# Patient Record
Sex: Male | Born: 1945 | Race: White | Hispanic: No | Marital: Married | Smoking: Former smoker
Health system: Southern US, Community
[De-identification: ages and names within clinical notes are randomized; demographics above are authoritative.]

## PROBLEM LIST (undated history)

## (undated) DIAGNOSIS — I10 Essential (primary) hypertension: Secondary | ICD-10-CM

## (undated) DIAGNOSIS — K4090 Unilateral inguinal hernia, without obstruction or gangrene, not specified as recurrent: Secondary | ICD-10-CM

## (undated) DIAGNOSIS — K219 Gastro-esophageal reflux disease without esophagitis: Secondary | ICD-10-CM

## (undated) DIAGNOSIS — I2510 Atherosclerotic heart disease of native coronary artery without angina pectoris: Secondary | ICD-10-CM

## (undated) DIAGNOSIS — C61 Malignant neoplasm of prostate: Secondary | ICD-10-CM

## (undated) DIAGNOSIS — M1990 Unspecified osteoarthritis, unspecified site: Secondary | ICD-10-CM

## (undated) DIAGNOSIS — I209 Angina pectoris, unspecified: Secondary | ICD-10-CM

## (undated) HISTORY — PX: KNEE ARTHROSCOPY: SHX127

## (undated) HISTORY — PX: TONSILLECTOMY: SUR1361

## (undated) HISTORY — PX: INGUINAL HERNIA REPAIR: SUR1180

## (undated) HISTORY — PX: JOINT REPLACEMENT: SHX530

---

## 1998-03-10 ENCOUNTER — Other Ambulatory Visit: Admission: RE | Admit: 1998-03-10 | Discharge: 1998-03-10 | Payer: Self-pay | Admitting: Dermatology

## 2000-11-02 ENCOUNTER — Encounter (INDEPENDENT_AMBULATORY_CARE_PROVIDER_SITE_OTHER): Payer: Self-pay | Admitting: Specialist

## 2000-11-02 ENCOUNTER — Other Ambulatory Visit: Admission: RE | Admit: 2000-11-02 | Discharge: 2000-11-02 | Payer: Self-pay | Admitting: Urology

## 2000-12-06 HISTORY — PX: PROSTATECTOMY: SHX69

## 2000-12-09 ENCOUNTER — Encounter: Payer: Self-pay | Admitting: Urology

## 2000-12-13 ENCOUNTER — Inpatient Hospital Stay (HOSPITAL_COMMUNITY): Admission: RE | Admit: 2000-12-13 | Discharge: 2000-12-17 | Payer: Self-pay | Admitting: Urology

## 2000-12-13 ENCOUNTER — Encounter (INDEPENDENT_AMBULATORY_CARE_PROVIDER_SITE_OTHER): Payer: Self-pay

## 2007-09-06 HISTORY — PX: SHOULDER ARTHROSCOPY: SHX128

## 2007-09-26 ENCOUNTER — Ambulatory Visit (HOSPITAL_BASED_OUTPATIENT_CLINIC_OR_DEPARTMENT_OTHER): Admission: RE | Admit: 2007-09-26 | Discharge: 2007-09-26 | Payer: Self-pay | Admitting: Orthopedic Surgery

## 2011-04-20 NOTE — Op Note (Signed)
NAME:  Timothy Mendez, Timothy Mendez NO.:  [id number]   MEDICAL RECORD NO.:  [id number]          PATIENT TYPE:  AMB   LOCATION:  DSC                          FACILITY:  [facility]   PHYSICIAN:  Timothy Mendez, M.D. DATE OF BIRTH:  02-03-1946   DATE OF PROCEDURE:  09/26/2007  DATE OF DISCHARGE:                               OPERATIVE REPORT   PREOPERATIVE DIAGNOSIS:  Chronic pain right shoulder with unfavorable  acromioclavicular anatomy and MRI evidence of a partial-thickness tear  of the supraspinatus and conjoined tendons.   POSTOPERATIVE DIAGNOSIS:  Chronic adhesive capsulitis with marked  impairment of capsular range of motion, right shoulder and chronic stage  2+ impingement with prominent acromioclavicular arthropathy with an  inferior projecting distal clavicle and medial acromion causing chronic  stage 2+ impingement.   OPERATION:  1. Examination of right shoulder under anesthesia documenting      significant right shoulder adhesive capsulitis.  2. Arthroscopic evaluation of right glenohumeral joint with      debridement of labral degenerative fray/tear, debridement of      adhesive capsulitis granulation tissue and scar adhesions and      inspection of rotator cuff.  3. Arthroscopic subacromial debridement with bursectomy,      coracoacromial ligament relaxation and extensive bursectomy.  4. Arthroscopic subacromial decompression with removal of osteophyte      at medial acromion, anterior and lateral acromion.  5. Arthroscopic distal clavicle resection.   OPERATING SURGEON:  Timothy Mendez, M.D.   ASSISTANT:  Timothy Maduro Dasnoit PA-C.   ANESTHESIA:  General endotracheal supplemented by a right interscalene  block, supervising anesthesiologist is Dr. Sampson Mendez.   INDICATIONS:  Timothy Mendez is a 65 year old gentleman referred  by Dr. Earl Mendez for evaluation of a painful right shoulder.   He has a history of night pain, loss of range of motion, impingement  type symptoms and possible rotator cuff weakness.  Clinical examination  suggested unfavorable acromial anatomy, sclerosis of greater tuberosity  consistent with stage 2 or 3 impingement and MRI of the shoulder  documented extensive tendinopathy rotator cuff with a possible  intrasubstance tear of the supraspinatus and the conjoint tendon.   Due to failure to respond to nonoperative measures, Timothy Mendez is  brought to the operating at this time anticipating arthroscopic  evaluation of shoulder, subacromial decompression, distal clavicle  resection and rotator cuff repair as indicated by our findings at  surgery.   After detailed informed consent, both in the office and in the holding  area, he is brought to the operating room at this time.   Written questions were provided by his wife in the holding area and  answered in detail.   PROCEDURE:  Timothy Mendez is brought to the operating room and  placed supine position on the table.   Following an anesthesia consult with Dr. Sampson Mendez a right interscalene  block was placed without complication.   Timothy Mendez was placed under general endotracheal anesthesia under Dr.  Jarrett Mendez direct supervision followed by careful positioning in the  beach-chair position with the aid of a torso and head holder designed  for  shoulder arthroscopy.   The right upper extremity and forequarter were prepped with DuraPrep and  draped with impervious arthroscopy drapes.   The procedure commenced with examination of the right shoulder under  anesthesia.  The combined elevation of the shoulder was noted to be 160  degrees.  Timothy Mendez lacked 30 degrees of extension to the scapular  plane.  He had external rotation of 80 degrees and internal rotation of  60 degrees.  This evidenced moderate adhesive capsulitis.   The scope was introduced through a standard posterior viewing portal..  Diagnostic arthroscopy revealed considerable degenerative  change in the  superior and anterior labrum.  There was a remarkable degree of the  glenohumeral granulation tissue obscuring the anatomy of the anterior  capsular ligaments.   We had some significant technical difficulties with our camera system  with some type of digital camera malfunction requiring [place] to switch both  our camera and our monitors during the procedure.  Some of our initial  images from the surgery including the debridement of granulation tissue  were lost when our circulating nurse turned off the power to our initial  arthroscopic tower.   The deep surface of the rotator cuff was carefully inspected.  There was  no sign of a retracted or significantly degenerative tear of the  supraspinatus, infraspinatus noted within the joint.  Careful study of  the preoperative MRI suggested that Timothy Mendez had some cavitation  within the substance of the tendon.   After completion of debridement of the granulation tissue and tenolysis  of the subscapularis and removal of adhesions from the anterior capsular  ligaments.  A bipolar cautery was used for hemostasis.   The scope was removed glenohumeral joint and placed in the subacromial  space.  There were considerable bursal adhesions.  These were debrided  to reveal the anatomy of the coracoacromial arch.  The medial acromion  and distal clavicle were quite prominent and causing abrasion of the  cuff.   After clearance of the bursa with the cautery and suction shaver, the  acromion was leveled to a type 1 morphology with removal of about 5 mm  of bone from the medial margin of the acromion at the [facility] joint and  across the anterior acromion and lateral anterior acromion.   The coracoacromial ligament was released with cutting cautery and  hemostasis achieved with bipolar cautery.  The bursa overlying the cuff  was thoroughly debrided.  The rotator cuff was carefully inspected.  There is no sign of retracted cuff tear on the  bursal side.  The capsule  of the [facility] joint was taken down with the cutting cautery followed by  resection of the distal 8 mm clavicle superiorly and perhaps 15 mm of  clavicle inferiorly with the suction bur.  Photographic documentation of  the clavicle resection was accomplished with a digital camera.   We were challenged to throughout the case by tendency for Timothy Mendez to  remain hypertensive.  Dr. Sampson Mendez was consulted and provided  hydralazine as an antihypertensive.   Timothy Mendez has not been noted to be hypertensive preoperatively, but we  will inform Timothy Mendez and Dr. 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   At the conclusion of the case the portals repaired with mattress suture  of 3-0 Prolene.  The wounds were otherwise dressed with Xeroflo sterile  gauze and a paper tape dressing.  There no apparent complications other  than our challenges  with our arthroscopic visualization software and  imaging system.      Timothy Fitch Mendez, M.D.  Electronically Signed     RVS/MEDQ  D:  09/26/2007  T:  09/27/2007  Job:  [id number]   cc:   Timothy Mendez, M.D.

## 2011-04-23 NOTE — H&P (Signed)
[facility]  Patient:    Timothy Mendez, Timothy Mendez               MRN: [id number] Adm. Date:  [id number] Attending:  Ellwood Handler                         History and Physical  DATE OF BIRTH:  10-23-1946  REFERRING PHYSICIAN:  Winn Jock. Earl Gala, M.D.  INDICATIONS:  A 65 year old male with prostate cancer.  ______: 1. November 2001 urologic physical examination with [name] revealed    subtle asymmetry of the prostate, left lobe larger than right.  PSA has    ranged from 2.0-1.4. 2. 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  Bone scan and CT scan not done due    to the low volume of the disease and low PSA. 3. Discussed with patient and family members the various options available to    [place] including watchful waiting, hormonal therapy, external radiation    therapy, seed implant therapy, and prostatectomy either retropubic or    perineal.  Patient was offered referral to a ______ care center, but    preferred to stay here in [place]. 4. After patient had time to "think it over" he was inclined to proceed with    surgical prostatectomy due to his young age, family history, and overall    good health.  I have discussed with him the risks and benefits of    prostatectomy including bleeding, infection, impotence, incontinence as    well as nerve, vascular, and rectal injury.  He understands the risks and    benefits and agrees to proceed.  MEDICATIONS:  Aspirin and ibuprofen just on a p.r.n. basis.  ALLERGIES:  Denies.  SOCIAL HISTORY:  Tobacco:  Patient wisely quit smoking in 1976.  ETOH: Occasional.  A 11 year old wife.  Has three children-32, 30, and 47 years of age.  PAST MEDICAL HISTORY:  Right knee 1967 and 1981, hernia repair in 1950.  PHYSICAL EXAMINATION:  GENERAL:  Healthy, active, and alert 65 year old male looking younger than his staged  age.  VITAL SIGNS:  Weight 201 pounds, blood pressure 146/87, pulse 64, temperature 97.8.  NECK:  Negative adenopathy.  Negative bruit.  LUNGS:  Clear to P&A.  HEART:  Regular rate and rhythm without murmur or gallop.  ABDOMEN:  Soft, protuberant with positive bowel sounds.  No organomegaly or mass.  GENITOURINARY:  Left lobe of the prostate larger than the right.  Bilaterally descended testes.  No evidence of inguinal hernia.  NEUROLOGIC:  Grossly intact.  LABORATORIES:  BUN 15, creatinine 1.1.  PT 12.5, PTT 28.  White count 5.1, hemoglobin 14.1.  Patient has 2 units of autologous blood available.  Chest x-ray:  "No active lung disease."  EKG shows normal sinus rhythm with unspecified T-wave abnormalities.  PLAN:  Will proceed this a.m. with retropubic prostatectomy. DD:  12/13/00 TD:  12/13/00 Job: [id number]

## 2011-04-23 NOTE — Op Note (Signed)
[facility]  Patient:    Kirsten, Mckone WHITFIELD DULAY               MRN: [id number] Proc. Date: 12/13/00 Adm. Date:  [id number] Attending:  Ellwood Handler CC:         Winn Jock. Earl Gala, M.D.                           Operative Report  DATE OF BIRTH:  [birth year].  REFERRING PHYSICIAN:  Winn Jock. Earl Gala, M.D.  PREOPERATIVE DIAGNOSIS:  Prostate cancer.  POSTOPERATIVE DIAGNOSIS:  Prostate cancer.  UROLOGIST:  Verl Dicker, M.D.  FIRST ASSISTANT:  Rozanna Boer., M.D.  PROCEDURE:  Radical retropubic prostatectomy and pelvic lymph node dissection.  SPECIMENS:  Prostate with attached vas and seminal vesicles, right and left pelvic lymph nodes.  ESTIMATED BLOOD LOSS:  1100 cc.  DRAINS:  A 20-French Foley, 10-French JP.  DESCRIPTION OF PROCEDURE:  Patient was prepped and draped in the dorsal lithotomy position after institution of an adequate level of general anesthesia.  Midline infraumbilical incision was carried through the skin and muscular layers of the anterior abdominal wall.  Bookwalter retractor was put in position, taking care to retract the peritoneal cavity superiorly, with good visualization of the retropubic space as well as the right and left obturator fossae.  No dissection was carried out on both the right and left sides between the external iliac veins superiorly and the obturator nerves inferiorly.  Aggregate of soft fibrolymphatic tissue was removed in both cases.  No enlarged or indurated nodes were identified.  All nodes were sent for permanent section.  Endopelvic fascia was taken down sharply lateral to the prostate.  Puboprostatic ligaments were then taken down sharply. Right-angle clamp was then passed distal to the prostatic apex, anterior to the urethra, dorsal vein complex oversewn with three free ties of 0 Vicryl, back-bleeding stitch placed on the anterior prostate also of 0 Vicryl; dorsal vein  complex was then divided, providing clear visualization of the urethra. Careful dissection around the urethra freed it from the surrounding attachments.  It was then divided with a scalpel.  Indwelling Foley catheter was retracted superiorly.  Right and left prostatic pedicles were then taken down segmentally, taking every care to preserve the neurovascular bundles. There was some adherence of tissue to the neurovascular bundle on the left, but we maintained an adequate margin, as this was the site of the patients tumor.  There was no evidence of adherence on the right side.  Muscular fibers at the bladder neck were then divided using fine tonsils and needle-tip hemostat.  Longitudinal muscle fibers were preserved as they entered the prostatic base.  Bladder neck was then transected and bladder was retracted superiorly.  Right and left seminal vesicles were then dissected free. Right-angle clamp was passed about three-quarters of the length of the seminal vesicle.  It was then divided.  Prostate with attached vaginal seminal vesicles was then sent to pathology.  Ends of the seminal vesicles were then ligated with a free tie of 2-0 silk.  Mucosa at the bladder neck was then everted with interrupted sutures of 4-0 chromic.  Greenwald sound was then passed per urethra and stitches were placed in the urethral stump at the 12 oclock, 10 oclock, 7 oclock, 5 oclock and 2 oclock positions.  A 20-French silicone catheter was then passed per urethra and guided into the bladder.  Stitches which had  been placed in the urethral stump were then brought through the bladder neck in an identical configuration.  Fifteen cc of water were placed in a 5 cc balloon.  Urethral anastomosis was then sewn down and shown to be watertight.  A 10 flat Jackson-Pratt drain was brought out through a stab incision in the left lower quadrant.  Fascia was reapproximated with a running suture of #1 PDS and skin was closed with  skin staples. Patient was returned to recovery in satisfactory condition. DD:  12/13/00 TD:  12/13/00 Job: [id number]

## 2011-04-23 NOTE — Discharge Summary (Signed)
[facility]  Patient:    Timothy Mendez, Timothy Mendez               MRN: [id number] Adm. Date:  [id number] Disc. Date: [id number] Attending:  Ellwood Handler                           Discharge Summary  DATE OF BIRTH:  1946-11-28  INTERNIST:  Winn Jock. Earl Gala, M.D.  UROLOGIST:  Verl Dicker, M.D.  Indications, medications, allergies, tobacco, ETOH, past medical history, social history, physical examination, and review of systems, all outlined in the admitting note from December 13, 2000.  HOSPITAL COURSE:  The patient was admitted on December 13, 2000.  Radical retropubic prostatectomy and pelvic lymph node dissection that morning. Postoperative day #1, hemoglobin 10.1.  Good urine output, declining JP output.  By postoperative day #2, however, hemoglobin had fallen to 8.7.  The patient had donated autologous blood.  Two units were given without difficulty.  By postoperative day #3, hemoglobin was 11.6, Jackson-Pratt output had declined to about 15 cc per shift.  Jackson-Pratt was removed, and the patient was discharged home on postoperative day #4, December 17, 2000.  DISCHARGE MEDICATIONS: 1. Cipro. 2. Darvocet. 3. Ditropan.  Pathology report was pending at the time of dictation.  PLAN:  Office visit, Foley catheter removal, staple removal, in 10 to 14 days. The patient will call the office if he has any questions or problems. DD:  12/30/00 TD:  12/30/00 Job: [id number]

## 2011-09-15 LAB — POCT HEMOGLOBIN-HEMACUE
Hemoglobin: 16.8
Operator id: 112821

## 2011-12-09 DIAGNOSIS — D239 Other benign neoplasm of skin, unspecified: Secondary | ICD-10-CM | POA: Diagnosis not present

## 2011-12-09 DIAGNOSIS — L570 Actinic keratosis: Secondary | ICD-10-CM | POA: Diagnosis not present

## 2011-12-09 DIAGNOSIS — L851 Acquired keratosis [keratoderma] palmaris et plantaris: Secondary | ICD-10-CM | POA: Diagnosis not present

## 2011-12-09 DIAGNOSIS — L821 Other seborrheic keratosis: Secondary | ICD-10-CM | POA: Diagnosis not present

## 2011-12-09 DIAGNOSIS — D485 Neoplasm of uncertain behavior of skin: Secondary | ICD-10-CM | POA: Diagnosis not present

## 2011-12-29 DIAGNOSIS — R339 Retention of urine, unspecified: Secondary | ICD-10-CM | POA: Diagnosis not present

## 2011-12-29 DIAGNOSIS — N393 Stress incontinence (female) (male): Secondary | ICD-10-CM | POA: Diagnosis not present

## 2011-12-29 DIAGNOSIS — C61 Malignant neoplasm of prostate: Secondary | ICD-10-CM | POA: Diagnosis not present

## 2011-12-29 DIAGNOSIS — N529 Male erectile dysfunction, unspecified: Secondary | ICD-10-CM | POA: Diagnosis not present

## 2012-03-15 DIAGNOSIS — L600 Ingrowing nail: Secondary | ICD-10-CM | POA: Diagnosis not present

## 2012-05-15 DIAGNOSIS — H109 Unspecified conjunctivitis: Secondary | ICD-10-CM | POA: Diagnosis not present

## 2012-05-16 DIAGNOSIS — H52229 Regular astigmatism, unspecified eye: Secondary | ICD-10-CM | POA: Diagnosis not present

## 2012-05-16 DIAGNOSIS — H5200 Hypermetropia, unspecified eye: Secondary | ICD-10-CM | POA: Diagnosis not present

## 2012-05-16 DIAGNOSIS — H1030 Unspecified acute conjunctivitis, unspecified eye: Secondary | ICD-10-CM | POA: Diagnosis not present

## 2012-05-29 DIAGNOSIS — H04129 Dry eye syndrome of unspecified lacrimal gland: Secondary | ICD-10-CM | POA: Diagnosis not present

## 2012-05-29 DIAGNOSIS — H52229 Regular astigmatism, unspecified eye: Secondary | ICD-10-CM | POA: Diagnosis not present

## 2012-05-29 DIAGNOSIS — H5200 Hypermetropia, unspecified eye: Secondary | ICD-10-CM | POA: Diagnosis not present

## 2012-06-07 DIAGNOSIS — E7800 Pure hypercholesterolemia, unspecified: Secondary | ICD-10-CM | POA: Diagnosis not present

## 2012-06-07 DIAGNOSIS — Z1331 Encounter for screening for depression: Secondary | ICD-10-CM | POA: Diagnosis not present

## 2012-06-07 DIAGNOSIS — Z0000 Encounter for general adult medical examination without abnormal findings: Secondary | ICD-10-CM | POA: Diagnosis not present

## 2012-06-07 DIAGNOSIS — Z79899 Other long term (current) drug therapy: Secondary | ICD-10-CM | POA: Diagnosis not present

## 2012-07-04 DIAGNOSIS — D044 Carcinoma in situ of skin of scalp and neck: Secondary | ICD-10-CM | POA: Diagnosis not present

## 2012-07-05 DIAGNOSIS — Z1211 Encounter for screening for malignant neoplasm of colon: Secondary | ICD-10-CM | POA: Diagnosis not present

## 2012-07-05 DIAGNOSIS — D126 Benign neoplasm of colon, unspecified: Secondary | ICD-10-CM | POA: Diagnosis not present

## 2012-07-05 DIAGNOSIS — K5730 Diverticulosis of large intestine without perforation or abscess without bleeding: Secondary | ICD-10-CM | POA: Diagnosis not present

## 2012-07-10 DIAGNOSIS — L821 Other seborrheic keratosis: Secondary | ICD-10-CM | POA: Diagnosis not present

## 2012-07-10 DIAGNOSIS — L570 Actinic keratosis: Secondary | ICD-10-CM | POA: Diagnosis not present

## 2012-07-10 DIAGNOSIS — C4442 Squamous cell carcinoma of skin of scalp and neck: Secondary | ICD-10-CM | POA: Diagnosis not present

## 2012-09-18 DIAGNOSIS — Z23 Encounter for immunization: Secondary | ICD-10-CM | POA: Diagnosis not present

## 2012-09-26 DIAGNOSIS — M1710 Unilateral primary osteoarthritis, unspecified knee: Secondary | ICD-10-CM | POA: Diagnosis not present

## 2012-11-01 DIAGNOSIS — M719 Bursopathy, unspecified: Secondary | ICD-10-CM | POA: Diagnosis not present

## 2012-11-01 DIAGNOSIS — M67919 Unspecified disorder of synovium and tendon, unspecified shoulder: Secondary | ICD-10-CM | POA: Diagnosis not present

## 2012-11-07 DIAGNOSIS — M719 Bursopathy, unspecified: Secondary | ICD-10-CM | POA: Diagnosis not present

## 2012-11-07 DIAGNOSIS — M67919 Unspecified disorder of synovium and tendon, unspecified shoulder: Secondary | ICD-10-CM | POA: Diagnosis not present

## 2012-11-07 DIAGNOSIS — M24119 Other articular cartilage disorders, unspecified shoulder: Secondary | ICD-10-CM | POA: Diagnosis not present

## 2012-11-14 DIAGNOSIS — M719 Bursopathy, unspecified: Secondary | ICD-10-CM | POA: Diagnosis not present

## 2012-11-14 DIAGNOSIS — M24119 Other articular cartilage disorders, unspecified shoulder: Secondary | ICD-10-CM | POA: Diagnosis not present

## 2012-11-21 DIAGNOSIS — M719 Bursopathy, unspecified: Secondary | ICD-10-CM | POA: Diagnosis not present

## 2012-11-21 DIAGNOSIS — M779 Enthesopathy, unspecified: Secondary | ICD-10-CM | POA: Diagnosis not present

## 2012-11-21 DIAGNOSIS — M24119 Other articular cartilage disorders, unspecified shoulder: Secondary | ICD-10-CM | POA: Diagnosis not present

## 2012-12-12 DIAGNOSIS — L570 Actinic keratosis: Secondary | ICD-10-CM | POA: Diagnosis not present

## 2012-12-12 DIAGNOSIS — D239 Other benign neoplasm of skin, unspecified: Secondary | ICD-10-CM | POA: Diagnosis not present

## 2012-12-12 DIAGNOSIS — L819 Disorder of pigmentation, unspecified: Secondary | ICD-10-CM | POA: Diagnosis not present

## 2012-12-12 DIAGNOSIS — L821 Other seborrheic keratosis: Secondary | ICD-10-CM | POA: Diagnosis not present

## 2012-12-12 DIAGNOSIS — D1801 Hemangioma of skin and subcutaneous tissue: Secondary | ICD-10-CM | POA: Diagnosis not present

## 2012-12-12 DIAGNOSIS — M24119 Other articular cartilage disorders, unspecified shoulder: Secondary | ICD-10-CM | POA: Diagnosis not present

## 2012-12-12 DIAGNOSIS — L820 Inflamed seborrheic keratosis: Secondary | ICD-10-CM | POA: Diagnosis not present

## 2012-12-12 DIAGNOSIS — M719 Bursopathy, unspecified: Secondary | ICD-10-CM | POA: Diagnosis not present

## 2013-01-03 DIAGNOSIS — M24119 Other articular cartilage disorders, unspecified shoulder: Secondary | ICD-10-CM | POA: Diagnosis not present

## 2013-01-03 DIAGNOSIS — M719 Bursopathy, unspecified: Secondary | ICD-10-CM | POA: Diagnosis not present

## 2013-01-03 DIAGNOSIS — M67919 Unspecified disorder of synovium and tendon, unspecified shoulder: Secondary | ICD-10-CM | POA: Diagnosis not present

## 2013-01-12 DIAGNOSIS — N4340 Spermatocele of epididymis, unspecified: Secondary | ICD-10-CM | POA: Diagnosis not present

## 2013-01-12 DIAGNOSIS — C61 Malignant neoplasm of prostate: Secondary | ICD-10-CM | POA: Diagnosis not present

## 2013-01-12 DIAGNOSIS — N139 Obstructive and reflux uropathy, unspecified: Secondary | ICD-10-CM | POA: Diagnosis not present

## 2013-01-12 DIAGNOSIS — N529 Male erectile dysfunction, unspecified: Secondary | ICD-10-CM | POA: Diagnosis not present

## 2013-01-31 DIAGNOSIS — M67919 Unspecified disorder of synovium and tendon, unspecified shoulder: Secondary | ICD-10-CM | POA: Diagnosis not present

## 2013-05-17 DIAGNOSIS — H5200 Hypermetropia, unspecified eye: Secondary | ICD-10-CM | POA: Diagnosis not present

## 2013-05-17 DIAGNOSIS — H52229 Regular astigmatism, unspecified eye: Secondary | ICD-10-CM | POA: Diagnosis not present

## 2013-05-17 DIAGNOSIS — H524 Presbyopia: Secondary | ICD-10-CM | POA: Diagnosis not present

## 2013-05-17 DIAGNOSIS — H04129 Dry eye syndrome of unspecified lacrimal gland: Secondary | ICD-10-CM | POA: Diagnosis not present

## 2013-06-11 DIAGNOSIS — Z79899 Other long term (current) drug therapy: Secondary | ICD-10-CM | POA: Diagnosis not present

## 2013-06-11 DIAGNOSIS — Z0000 Encounter for general adult medical examination without abnormal findings: Secondary | ICD-10-CM | POA: Diagnosis not present

## 2013-06-11 DIAGNOSIS — Z1331 Encounter for screening for depression: Secondary | ICD-10-CM | POA: Diagnosis not present

## 2013-06-13 DIAGNOSIS — H6120 Impacted cerumen, unspecified ear: Secondary | ICD-10-CM | POA: Diagnosis not present

## 2013-06-13 DIAGNOSIS — H903 Sensorineural hearing loss, bilateral: Secondary | ICD-10-CM | POA: Diagnosis not present

## 2013-07-25 DIAGNOSIS — M7750 Other enthesopathy of unspecified foot: Secondary | ICD-10-CM | POA: Diagnosis not present

## 2013-08-08 DIAGNOSIS — M7750 Other enthesopathy of unspecified foot: Secondary | ICD-10-CM | POA: Diagnosis not present

## 2013-09-21 DIAGNOSIS — Z23 Encounter for immunization: Secondary | ICD-10-CM | POA: Diagnosis not present

## 2013-12-13 DIAGNOSIS — D1801 Hemangioma of skin and subcutaneous tissue: Secondary | ICD-10-CM | POA: Diagnosis not present

## 2013-12-13 DIAGNOSIS — L570 Actinic keratosis: Secondary | ICD-10-CM | POA: Diagnosis not present

## 2013-12-13 DIAGNOSIS — L919 Hypertrophic disorder of the skin, unspecified: Secondary | ICD-10-CM | POA: Diagnosis not present

## 2013-12-13 DIAGNOSIS — L820 Inflamed seborrheic keratosis: Secondary | ICD-10-CM | POA: Diagnosis not present

## 2013-12-13 DIAGNOSIS — L819 Disorder of pigmentation, unspecified: Secondary | ICD-10-CM | POA: Diagnosis not present

## 2013-12-13 DIAGNOSIS — L909 Atrophic disorder of skin, unspecified: Secondary | ICD-10-CM | POA: Diagnosis not present

## 2013-12-13 DIAGNOSIS — L821 Other seborrheic keratosis: Secondary | ICD-10-CM | POA: Diagnosis not present

## 2013-12-13 DIAGNOSIS — D239 Other benign neoplasm of skin, unspecified: Secondary | ICD-10-CM | POA: Diagnosis not present

## 2013-12-27 ENCOUNTER — Ambulatory Visit (INDEPENDENT_AMBULATORY_CARE_PROVIDER_SITE_OTHER): Payer: Medicare Other | Admitting: Podiatry

## 2013-12-27 ENCOUNTER — Encounter: Payer: Self-pay | Admitting: Podiatry

## 2013-12-27 VITALS — BP 124/85 | HR 62 | Resp 16

## 2013-12-27 DIAGNOSIS — M7750 Other enthesopathy of unspecified foot: Secondary | ICD-10-CM

## 2013-12-27 NOTE — Progress Notes (Signed)
Subjective:     Patient ID: Timothy Mendez, male   DOB: 05-01-1946, 68 y.o.   MRN: [id number]  HPI patient presents stating I have another flareup around my left foot that was quite bad that has gotten somewhat better over the last few days. 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 We will consider injection if symptoms remain persistent

## 2014-01-09 DIAGNOSIS — N393 Stress incontinence (female) (male): Secondary | ICD-10-CM | POA: Diagnosis not present

## 2014-01-09 DIAGNOSIS — N4340 Spermatocele of epididymis, unspecified: Secondary | ICD-10-CM | POA: Diagnosis not present

## 2014-01-09 DIAGNOSIS — R339 Retention of urine, unspecified: Secondary | ICD-10-CM | POA: Diagnosis not present

## 2014-01-09 DIAGNOSIS — C61 Malignant neoplasm of prostate: Secondary | ICD-10-CM | POA: Diagnosis not present

## 2014-01-25 ENCOUNTER — Other Ambulatory Visit: Payer: Medicare Other

## 2014-01-30 ENCOUNTER — Ambulatory Visit (INDEPENDENT_AMBULATORY_CARE_PROVIDER_SITE_OTHER): Payer: Medicare Other | Admitting: Podiatry

## 2014-01-30 ENCOUNTER — Encounter: Payer: Self-pay | Admitting: Podiatry

## 2014-01-30 VITALS — BP 163/86 | HR 71 | Resp 16

## 2014-01-30 DIAGNOSIS — M7750 Other enthesopathy of unspecified foot: Secondary | ICD-10-CM

## 2014-01-30 NOTE — Patient Instructions (Signed)

## 2014-01-31 NOTE — Progress Notes (Signed)
Subjective:     Patient ID: Timothy Mendez, male   DOB: Apr 27, 1946, 68 y.o.   MRN: [id number]  HPI patient presents orthotic pickup stating his tendon is improving but still mildly sore left foot   Review of Systems     Objective:   Physical Exam Neurovascular status intact with well orientated x3 and muscle strength adequate with inflammation of a mild nature around the posterior tibial insertion left    Assessment:     Tendinitis left still noted    Plan:     Dispensed orthotics with instructions and reviewed physical therapy and anti-inflammatories to reduce stress against the feet. Reappoint 6 weeks

## 2014-04-01 ENCOUNTER — Ambulatory Visit (INDEPENDENT_AMBULATORY_CARE_PROVIDER_SITE_OTHER): Payer: Medicare Other | Admitting: Podiatry

## 2014-04-01 ENCOUNTER — Encounter: Payer: Self-pay | Admitting: Podiatry

## 2014-04-01 VITALS — BP 134/81 | HR 58 | Resp 18

## 2014-04-01 DIAGNOSIS — M7750 Other enthesopathy of unspecified foot: Secondary | ICD-10-CM

## 2014-04-01 NOTE — Progress Notes (Signed)
° °  Subjective:    Patient ID: Timothy Mendez, male    DOB: 01-01-1946, 68 y.o.   MRN: [id number]  HPI The inserts are doing good and I can wear them all day    Review of Systems     Objective:   Physical Exam        Assessment & Plan:

## 2014-04-01 NOTE — Progress Notes (Signed)
Subjective:     Patient ID: Timothy Mendez, male   DOB: 1946/01/06, 68 y.o.   MRN: [id number]  HPI patient presents stating my foot is doing very well and I am having minimal discomfort   Review of Systems     Objective:   Physical Exam Neurovascular status intact with significant diminishment of discomfort left plantar heel and arch and in the posterior tibial insertion navicular left    Assessment:     Improved fasciitis tendinitis left    Plan:     Discussed continued physical therapy orthotic usage and shoe gear modifications and choices. Patient will be seen back to recheck again as needed

## 2014-06-03 DIAGNOSIS — H25019 Cortical age-related cataract, unspecified eye: Secondary | ICD-10-CM | POA: Diagnosis not present

## 2014-06-03 DIAGNOSIS — H2510 Age-related nuclear cataract, unspecified eye: Secondary | ICD-10-CM | POA: Diagnosis not present

## 2014-06-18 DIAGNOSIS — H903 Sensorineural hearing loss, bilateral: Secondary | ICD-10-CM | POA: Diagnosis not present

## 2014-07-08 DIAGNOSIS — Z0000 Encounter for general adult medical examination without abnormal findings: Secondary | ICD-10-CM | POA: Diagnosis not present

## 2014-07-08 DIAGNOSIS — Z1331 Encounter for screening for depression: Secondary | ICD-10-CM | POA: Diagnosis not present

## 2014-07-08 DIAGNOSIS — Z23 Encounter for immunization: Secondary | ICD-10-CM | POA: Diagnosis not present

## 2014-07-08 DIAGNOSIS — M1710 Unilateral primary osteoarthritis, unspecified knee: Secondary | ICD-10-CM | POA: Diagnosis not present

## 2014-07-08 DIAGNOSIS — Z791 Long term (current) use of non-steroidal anti-inflammatories (NSAID): Secondary | ICD-10-CM | POA: Diagnosis not present

## 2014-07-08 DIAGNOSIS — IMO0002 Reserved for concepts with insufficient information to code with codable children: Secondary | ICD-10-CM | POA: Diagnosis not present

## 2014-07-08 DIAGNOSIS — E7800 Pure hypercholesterolemia, unspecified: Secondary | ICD-10-CM | POA: Diagnosis not present

## 2014-09-25 DIAGNOSIS — Z23 Encounter for immunization: Secondary | ICD-10-CM | POA: Diagnosis not present

## 2014-10-03 DIAGNOSIS — L570 Actinic keratosis: Secondary | ICD-10-CM | POA: Diagnosis not present

## 2014-11-21 DIAGNOSIS — R0982 Postnasal drip: Secondary | ICD-10-CM | POA: Diagnosis not present

## 2014-11-21 DIAGNOSIS — J069 Acute upper respiratory infection, unspecified: Secondary | ICD-10-CM | POA: Diagnosis not present

## 2014-12-16 DIAGNOSIS — D2271 Melanocytic nevi of right lower limb, including hip: Secondary | ICD-10-CM | POA: Diagnosis not present

## 2014-12-16 DIAGNOSIS — L812 Freckles: Secondary | ICD-10-CM | POA: Diagnosis not present

## 2014-12-16 DIAGNOSIS — L821 Other seborrheic keratosis: Secondary | ICD-10-CM | POA: Diagnosis not present

## 2014-12-16 DIAGNOSIS — B351 Tinea unguium: Secondary | ICD-10-CM | POA: Diagnosis not present

## 2014-12-16 DIAGNOSIS — D1801 Hemangioma of skin and subcutaneous tissue: Secondary | ICD-10-CM | POA: Diagnosis not present

## 2014-12-16 DIAGNOSIS — L578 Other skin changes due to chronic exposure to nonionizing radiation: Secondary | ICD-10-CM | POA: Diagnosis not present

## 2014-12-16 DIAGNOSIS — L814 Other melanin hyperpigmentation: Secondary | ICD-10-CM | POA: Diagnosis not present

## 2015-03-13 ENCOUNTER — Encounter (HOSPITAL_COMMUNITY): Payer: Self-pay | Admitting: Emergency Medicine

## 2015-03-13 ENCOUNTER — Emergency Department (HOSPITAL_COMMUNITY): Payer: Medicare Other

## 2015-03-13 ENCOUNTER — Inpatient Hospital Stay (HOSPITAL_COMMUNITY)
Admission: EM | Admit: 2015-03-13 | Discharge: 2015-03-15 | DRG: 247 | Disposition: A | Discharge status: Home / Self Care | Payer: Medicare Other | Attending: Internal Medicine | Admitting: Internal Medicine

## 2015-03-13 ENCOUNTER — Observation Stay (HOSPITAL_COMMUNITY): Payer: Medicare Other

## 2015-03-13 DIAGNOSIS — K219 Gastro-esophageal reflux disease without esophagitis: Secondary | ICD-10-CM | POA: Diagnosis present

## 2015-03-13 DIAGNOSIS — I25110 Atherosclerotic heart disease of native coronary artery with unstable angina pectoris: Principal | ICD-10-CM | POA: Diagnosis present

## 2015-03-13 DIAGNOSIS — Z87891 Personal history of nicotine dependence: Secondary | ICD-10-CM

## 2015-03-13 DIAGNOSIS — Z8249 Family history of ischemic heart disease and other diseases of the circulatory system: Secondary | ICD-10-CM

## 2015-03-13 DIAGNOSIS — I10 Essential (primary) hypertension: Secondary | ICD-10-CM | POA: Diagnosis present

## 2015-03-13 DIAGNOSIS — Z79899 Other long term (current) drug therapy: Secondary | ICD-10-CM

## 2015-03-13 DIAGNOSIS — E785 Hyperlipidemia, unspecified: Secondary | ICD-10-CM | POA: Diagnosis not present

## 2015-03-13 DIAGNOSIS — I2510 Atherosclerotic heart disease of native coronary artery without angina pectoris: Secondary | ICD-10-CM | POA: Diagnosis present

## 2015-03-13 DIAGNOSIS — Z9079 Acquired absence of other genital organ(s): Secondary | ICD-10-CM | POA: Diagnosis present

## 2015-03-13 DIAGNOSIS — I200 Unstable angina: Secondary | ICD-10-CM | POA: Diagnosis not present

## 2015-03-13 DIAGNOSIS — M25569 Pain in unspecified knee: Secondary | ICD-10-CM | POA: Diagnosis present

## 2015-03-13 DIAGNOSIS — R079 Chest pain, unspecified: Secondary | ICD-10-CM | POA: Diagnosis present

## 2015-03-13 DIAGNOSIS — Z8546 Personal history of malignant neoplasm of prostate: Secondary | ICD-10-CM | POA: Diagnosis not present

## 2015-03-13 DIAGNOSIS — I25119 Atherosclerotic heart disease of native coronary artery with unspecified angina pectoris: Secondary | ICD-10-CM | POA: Insufficient documentation

## 2015-03-13 DIAGNOSIS — Z7982 Long term (current) use of aspirin: Secondary | ICD-10-CM

## 2015-03-13 DIAGNOSIS — R0789 Other chest pain: Secondary | ICD-10-CM | POA: Diagnosis not present

## 2015-03-13 HISTORY — DX: Malignant neoplasm of prostate: C61

## 2015-03-13 HISTORY — DX: Unspecified osteoarthritis, unspecified site: M19.90

## 2015-03-13 HISTORY — DX: Angina pectoris, unspecified: I20.9

## 2015-03-13 HISTORY — DX: Gastro-esophageal reflux disease without esophagitis: K21.9

## 2015-03-13 LAB — TROPONIN I
Troponin I: <0.03 ng/mL (ref <0.031)
Troponin I: <0.03 ng/mL (ref <0.031)

## 2015-03-13 LAB — CBC WITH DIFFERENTIAL/PLATELET
Basophils Absolute: 0 10*3/uL (ref 0.0–0.1)
Basophils Relative: 1 % (ref 0–1)
Eosinophils Absolute: 0.2 10*3/uL (ref 0.0–0.7)
Eosinophils Relative: 3 % (ref 0–5)
HEMATOCRIT: 44.8 % (ref 39.0–52.0)
Hemoglobin: 15.6 g/dL (ref 13.0–17.0)
Lymphocytes Relative: 20 % (ref 12–46)
Lymphs Abs: 1.3 10*3/uL (ref 0.7–4.0)
MCH: 32.6 pg (ref 26.0–34.0)
MCHC: 34.8 g/dL (ref 30.0–36.0)
MCV: 93.5 fL (ref 78.0–100.0)
MONOS PCT: 11 % (ref 3–12)
Monocytes Absolute: 0.7 10*3/uL (ref 0.1–1.0)
Neutro Abs: 4.2 10*3/uL (ref 1.7–7.7)
Neutrophils Relative %: 65 % (ref 43–77)
Platelets: 191 10*3/uL (ref 150–400)
RBC: 4.79 MIL/uL (ref 4.22–5.81)
RDW: 12.3 % (ref 11.5–15.5)
WBC: 6.5 10*3/uL (ref 4.0–10.5)

## 2015-03-13 LAB — PROTIME-INR
INR: 0.96 (ref 0.00–1.49)
PROTHROMBIN TIME: 12.8 s (ref 11.6–15.2)

## 2015-03-13 LAB — BASIC METABOLIC PANEL
Anion gap: 7 (ref 5–15)
BUN: 18 mg/dL (ref 6–23)
CO2: 31 mmol/L (ref 19–32)
Calcium: 9.3 mg/dL (ref 8.4–10.5)
Chloride: 101 mmol/L (ref 96–112)
Creatinine, Ser: 1.02 mg/dL (ref 0.50–1.35)
GFR calc Af Amer: 84 mL/min — ABNORMAL LOW (ref >90)
GFR calc non Af Amer: 72 mL/min — ABNORMAL LOW (ref >90)
GLUCOSE: 106 mg/dL — AB (ref 70–99)
Potassium: 3.9 mmol/L (ref 3.5–5.1)
Sodium: 139 mmol/L (ref 135–145)

## 2015-03-13 LAB — D-DIMER, QUANTITATIVE: D-Dimer, Quant: 0.32 ug/mL-FEU (ref 0.00–0.48)

## 2015-03-13 LAB — I-STAT TROPONIN, ED: TROPONIN I, POC: 0 ng/mL (ref 0.00–0.08)

## 2015-03-13 MED ORDER — SODIUM CHLORIDE 0.9 % IJ SOLN
3.0000 mL | Freq: Two times a day (BID) | INTRAMUSCULAR | Status: DC
  Administered 2015-03-14: 3 mL via INTRAVENOUS

## 2015-03-13 MED ORDER — ASPIRIN 81 MG PO CHEW
324.0000 mg | CHEWABLE_TABLET | Freq: Once | ORAL | Status: AC
  Administered 2015-03-13: 324 mg via ORAL
  Filled 2015-03-13: qty 4

## 2015-03-13 MED ORDER — SODIUM CHLORIDE 0.9 % IJ SOLN: 3.0000 mL | INTRAMUSCULAR | Status: DC | PRN

## 2015-03-13 MED ORDER — ASPIRIN 81 MG PO CHEW
81.0000 mg | CHEWABLE_TABLET | ORAL | Status: AC
  Administered 2015-03-14: 81 mg via ORAL
  Filled 2015-03-13: qty 1

## 2015-03-13 MED ORDER — SODIUM CHLORIDE 0.9 % IV SOLN: 250.0000 mL | INTRAVENOUS | Status: DC | PRN

## 2015-03-13 MED ORDER — GI COCKTAIL ~~LOC~~
30.0000 mL | Freq: Four times a day (QID) | ORAL | Status: DC | PRN
  Filled 2015-03-13: qty 30

## 2015-03-13 MED ORDER — ATORVASTATIN CALCIUM 40 MG PO TABS
40.0000 mg | ORAL_TABLET | Freq: Every day | ORAL | Status: DC
  Administered 2015-03-13 – 2015-03-14 (×2): 40 mg via ORAL
  Filled 2015-03-13 (×3): qty 1

## 2015-03-13 MED ORDER — IOHEXOL 350 MG/ML SOLN
80.0000 mL | Freq: Once | INTRAVENOUS | Status: AC | PRN
  Administered 2015-03-13: 80 mL via INTRAVENOUS

## 2015-03-13 MED ORDER — ACETAMINOPHEN 325 MG PO TABS
650.0000 mg | ORAL_TABLET | ORAL | Status: DC | PRN
  Administered 2015-03-13 – 2015-03-15 (×3): 650 mg via ORAL
  Filled 2015-03-13 (×3): qty 2

## 2015-03-13 MED ORDER — METOPROLOL TARTRATE 25 MG PO TABS
50.0000 mg | ORAL_TABLET | Freq: Once | ORAL | Status: AC
  Administered 2015-03-13: 50 mg via ORAL
  Filled 2015-03-13: qty 2

## 2015-03-13 MED ORDER — ENOXAPARIN SODIUM 40 MG/0.4ML ~~LOC~~ SOLN
40.0000 mg | SUBCUTANEOUS | Status: DC
  Administered 2015-03-13 – 2015-03-14 (×2): 40 mg via SUBCUTANEOUS
  Filled 2015-03-13 (×4): qty 0.4

## 2015-03-13 MED ORDER — MORPHINE SULFATE 2 MG/ML IJ SOLN: 2.0000 mg | INTRAMUSCULAR | Status: DC | PRN

## 2015-03-13 MED ORDER — METOPROLOL TARTRATE 25 MG PO TABS
25.0000 mg | ORAL_TABLET | Freq: Two times a day (BID) | ORAL | Status: DC
  Administered 2015-03-13 – 2015-03-15 (×4): 25 mg via ORAL
  Filled 2015-03-13 (×4): qty 1

## 2015-03-13 MED ORDER — ASPIRIN EC 81 MG PO TBEC
81.0000 mg | DELAYED_RELEASE_TABLET | Freq: Every day | ORAL | Status: DC
  Administered 2015-03-14 – 2015-03-15 (×2): 81 mg via ORAL
  Filled 2015-03-13 (×2): qty 1

## 2015-03-13 MED ORDER — ONDANSETRON HCL 4 MG/2ML IJ SOLN: 4.0000 mg | Freq: Four times a day (QID) | INTRAMUSCULAR | Status: DC | PRN

## 2015-03-13 MED ORDER — NITROGLYCERIN 0.4 MG SL SUBL
SUBLINGUAL_TABLET | SUBLINGUAL | Status: AC
  Administered 2015-03-13: 0.4 mg
  Filled 2015-03-13: qty 1

## 2015-03-13 MED ORDER — SODIUM CHLORIDE 0.9 % IV SOLN
INTRAVENOUS | Status: AC
  Administered 2015-03-13: 10:00:00 via INTRAVENOUS

## 2015-03-13 MED ORDER — SODIUM CHLORIDE 0.9 % IV SOLN
1.0000 mL/kg/h | INTRAVENOUS | Status: DC
  Administered 2015-03-14 (×2): 1 mL/kg/h via INTRAVENOUS

## 2015-03-13 NOTE — ED Notes (Signed)
Patient remains in CT - Wife in room and updated.

## 2015-03-13 NOTE — ED Provider Notes (Signed)
Medical screening examination/treatment/procedure(s) were conducted as a shared visit with non-physician practitioner(s) and myself.  I personally evaluated the patient during the encounter.   EKG Interpretation   Date/Time:  Thursday March 13 2015 05:20:32 EDT Ventricular Rate:  58 PR Interval:  168 QRS Duration: 109 QT Interval:  430 QTC Calculation: 422 R Axis:   41 Text Interpretation:  Sinus rhythm Borderline T abnormalities, inferior  leads No significant change since last tracing Confirmed by Glynn Octave [phone]) on 03/13/2015 5:23:52 AM      Results for orders placed or performed during the hospital encounter of 03/13/15  CBC with Differential/Platelet  Result Value Ref Range   WBC 6.5 4.0 - 10.5 K/uL   RBC 4.79 4.22 - 5.81 MIL/uL   Hemoglobin 15.6 13.0 - 17.0 g/dL   HCT 44.8 39.0 - 52.0 %   MCV 93.5 78.0 - 100.0 fL   MCH 32.6 26.0 - 34.0 pg   MCHC 34.8 30.0 - 36.0 g/dL   RDW 12.3 11.5 - 15.5 %   Platelets 191 150 - 400 K/uL   Neutrophils Relative % 65 43 - 77 %   Neutro Abs 4.2 1.7 - 7.7 K/uL   Lymphocytes Relative 20 12 - 46 %   Lymphs Abs 1.3 0.7 - 4.0 K/uL   Monocytes Relative 11 3 - 12 %   Monocytes Absolute 0.7 0.1 - 1.0 K/uL   Eosinophils Relative 3 0 - 5 %   Eosinophils Absolute 0.2 0.0 - 0.7 K/uL   Basophils Relative 1 0 - 1 %   Basophils Absolute 0.0 0.0 - 0.1 K/uL  Basic metabolic panel  Result Value Ref Range   Sodium 139 135 - 145 mmol/L   Potassium 3.9 3.5 - 5.1 mmol/L   Chloride 101 96 - 112 mmol/L   CO2 31 19 - 32 mmol/L   Glucose, Bld 106 (H) 70 - 99 mg/dL   BUN 18 6 - 23 mg/dL   Creatinine, Ser 1.02 0.50 - 1.35 mg/dL   Calcium 9.3 8.4 - 10.5 mg/dL   GFR calc non Af Amer 72 (L) >90 mL/min   GFR calc Af Amer 84 (L) >90 mL/min   Anion gap 7 5 - 15  Protime-INR  Result Value Ref Range   Prothrombin Time 12.8 11.6 - 15.2 seconds   INR 0.96 0.00 - 1.49  I-stat troponin, ED  Result Value Ref Range   Troponin i, poc 0.00 0.00 - 0.08  ng/mL   Comment 3           Dg Chest 2 View  03/13/2015   CLINICAL DATA:  Patient woke up at 2 a.m. with Mid chest pain.  EXAM: CHEST  2 VIEW  COMPARISON:  None.  FINDINGS: Normal heart size and pulmonary vascularity. No focal airspace disease or consolidation in the lungs. No blunting of costophrenic angles. No pneumothorax. Mediastinal contours appear intact. Degenerative changes in the spine.  IMPRESSION: No active cardiopulmonary disease.   Electronically Signed   By: Lucienne Capers M.D.   On: 03/13/2015 05:58    Patient seen by me. Patient with unusual left-sided chest pain just resolved about 15 minutes ago. Lasted for a long period of time. First troponin negative EKG without acute changes however patient's story very concerning for possible cardiac chest pain. D-dimer was ordered and is pending patient did have a long trip from [place] recently. Patient's nontoxic no acute distress. Pain is now resolved completely. Patient will require admission for cardiac rule  out. On exam heart regular rate and rhythm without any murmurs. Lungs are clear bilaterally. Chest nontender.  Fredia Sorrow, MD 03/13/15 [phone]

## 2015-03-13 NOTE — H&P (Signed)
[facility] History and Physical  Kiril Hippe HER:[id number] DOB: January 31, 1946 DOA: 03/13/2015  Referring physician: EDP PCP: Dorian Heckle, MD   Chief Complaint: Chest Pain  HPI: Timothy Mendez is a 69 y.o. male with a PMH of prostate cancer and proctectomy in 2002 who presents to the ED complaining of CP starting after he got back in bed after using the bathroom around 0230 this morning, rated at a 3/10 at onset. The pain does not radiate anywhere and he denies any other symptoms with the chest pain including SOB, palpitations, nausea, vomiting, jaw pain, arm pain or back pain. Further rest has not entirely relieved his pain. He has no hx of similar pain. Pt does have a hx of GERD and does take celebrex occasionally for knee pain. Does not seem to be excessive NSAID use and states this pain is different than his normal GERD pain which he describes as a burning sensation to midline of chest. His brother had a fatal MI at age 32 and his mother had CHF which she passed away from at age 72. Pt denies any changes in activity tolerance recently, gardens regularly and walks his dog. Of note, this past week patient drove to [place] and back, approximately 8 hours each way. He denies any calf pain or hx of DVT/PE.  Pt's EKG and initial troponin were both normal. Due to his age, family hx and abnormal CP presentation, will admit for telemetry monitoring and have consulted pt's cardiologist Dr. Wynonia Lawman. Will continue with serial troponins and pain management as needed.   Review of Systems:  Constitutional: Negative for sweating, clamminess.  HEENT: no changes in vision  Cardio-vascular: Positive for chest pain. Negative for swelling in lower extremities, palpitations  GI: No heartburn, indigestion, abdominal pain, nausea, vomiting, diarrhea, change in bowel habits, loss of appetite  Resp: No shortness of breath with exertion or at rest. No wheezing.No chest wall deformity  Skin: no  rash or lesions.  Musculoskeletal: No joint pain or swelling. No decreased range of motion. No back pain.   Otherwise see HPI  Past Medical History  Diagnosis Date  . Prostate cancer    Past Surgical History  Procedure Laterality Date  . Hernia repair    . Knee surgery    . Prostatectomy     Social History:  reports that he has quit smoking. He does not have any smokeless tobacco history on file. He reports that he drinks alcohol. He reports that he does not use illicit drugs.  No Known Allergies  Family History  Problem Relation Age of Onset  . Heart failure Mother   . Lung cancer Father   . Heart attack Brother   . Diabetes Brother      Prior to Admission medications   Medication Sig Start Date End Date Taking? Authorizing Provider  aspirin EC 81 MG tablet Take 81 mg by mouth daily.   Yes Historical Provider, MD  CALCIUM PO Take 1 tablet by mouth daily.   Yes Historical Provider, MD  celecoxib (CELEBREX) 200 MG capsule Take 200 mg by mouth 2 (two) times daily as needed for moderate pain.    Yes Historical Provider, MD  Cholecalciferol (VITAMIN D PO) Take 1 tablet by mouth daily.   Yes Historical Provider, MD  Glucosamine-Chondroitin (GLUCOSAMINE CHONDR COMPLEX PO) Take 1 tablet by mouth daily.   Yes Historical Provider, MD  Multiple Vitamins-Minerals (MULTIVITAMIN PO) Take 1 tablet by mouth daily.   Yes Historical Provider, MD  Omega-3  Fatty Acids (FISH OIL PO) Take 1 tablet by mouth daily.   Yes Historical Provider, MD   Physical Exam: Filed Vitals:   03/13/15 0630 03/13/15 0701 03/13/15 0730 03/13/15 0800  BP: 148/94 144/84 133/73 144/94  Pulse: 64 65 61 65  Temp:      TempSrc:      Resp: 16 23 20 21   Height:      Weight:      SpO2: 95% 97% 96% 100%    Wt Readings from Last 3 Encounters:  03/13/15 86.183 kg (190 lb)    General:  Appears calm and comfortable. Very pleasant nice man.  Eyes: PERRL, normal lids, irises & conjunctiva ENT: grossly normal hearing,  lips & tongue Neck: no LAD, masses or thyromegaly. No carotid bruits Cardiovascular: RRR, no m/r/g. No LE edema. Respiratory: CTA bilaterally, no w/r/r. Normal respiratory effort. Abdomen: soft, ntnd. Normoactive bowel sounds in all 4 quadrants Skin: no rash or induration seen on limited exam Musculoskeletal: grossly normal tone BUE/BLE. No tenderness to bilateral calves.  Psychiatric: grossly normal mood and affect, speech fluent and appropriate Neurologic: grossly non-focal.          Labs on Admission:  Basic Metabolic Panel:  Recent Labs Lab 03/13/15 0524  NA 139  K 3.9  CL 101  CO2 31  GLUCOSE 106*  BUN 18  CREATININE 1.02  CALCIUM 9.3   CBC:  Recent Labs Lab 03/13/15 0524  WBC 6.5  NEUTROABS 4.2  HGB 15.6  HCT 44.8  MCV 93.5  PLT 191   Cardiac Enzymes: No results for input(s): CKTOTAL, CKMB, CKMBINDEX, TROPONINI in the last 168 hours.  Radiological Exams on Admission: Dg Chest 2 View  03/13/2015   CLINICAL DATA:  Patient woke up at 2 a.m. with Mid chest pain.  EXAM: CHEST  2 VIEW  COMPARISON:  None.  FINDINGS: Normal heart size and pulmonary vascularity. No focal airspace disease or consolidation in the lungs. No blunting of costophrenic angles. No pneumothorax. Mediastinal contours appear intact. Degenerative changes in the spine.  IMPRESSION: No active cardiopulmonary disease.   Electronically Signed   By: Lucienne Capers M.D.   On: 03/13/2015 05:58    EKG: Independently reviewed. Rate 58. Normal sinus rhythm with borderline t wave abnormalities in the inferior leads.   Assessment/Plan Principal Problem:   Chest pain Active Problems:   Hypertension  Chest pain: - Started at rest with no personal cardiac hx.  - Significant family hx of MI and CHF. Hx of recent long car trip. D dimer negative - Negative initial troponin and EKG ~3 hours after onset. - Will admit for telemetry monitoring and serial troponins.  - his cardiologist Dr. Wynonia Lawman has been  consulted and recommended a coronary CT  Hypertension: - No hx of hypertension, max BP in ED of 184/79 on arrival, has continued to drop since then with no intervention, likely in the setting of pain.   Code Status: Full Code DVT Prophylaxis: Levonox, activity as tolerated.  Family Communication: Plan discussed with pt and wife at bedside, they agree with plan.  Disposition Plan: Likely discharge tomorrow am  Time spent: [place], PA-S [facility] Pager [phone]  Costin M. Cruzita Lederer, MD [facility] [phone]

## 2015-03-13 NOTE — ED Notes (Signed)
Patient transported to X-ray 

## 2015-03-13 NOTE — ED Notes (Signed)
Spoke with [facility] regarding patient.  Updated them that Dr. Wynonia Lawman wanted CTA results prior to admitting patient. Dr. Wynonia Lawman paged to make him aware that results are back.  Awaiting call back from Dr. Wynonia Lawman at this time.

## 2015-03-13 NOTE — ED Notes (Signed)
Hospitalist at bedside 

## 2015-03-13 NOTE — Progress Notes (Signed)
Patient remains free of chest pain tonight.  CTA shows a calcium score of 1900 as well as at least severe 2 vessel disease and may be more than that.  Met with patient and wife tonight and discussed the calcium score, CTA, and have recommended that he have cardiac catheterization tomorrow.  Cardiac catheterization procedure was discussed with the patient fully including risks of myocardial infarction, death, stroke, bleeding, arrhythmia, dye allergy, or renal insufficiency. The patient understands and is willing to proceed.  Possibility of stenting discussed with patient at the same time including risks.  Alternate treatment with medicines and bypass grafting also discussed with patient.  Patient would prefer a radial approach and procedure will be done by Dr. Irish Lack or Julianne Handler of [facility] heart care tomorrow.  We'll repeat EKG in the morning.  Go ahead and initiate statin therapy as well as beta blocker therapy.  Kerry Hough MD [facility]

## 2015-03-13 NOTE — ED Notes (Signed)
Cardiology at bedside.

## 2015-03-13 NOTE — Progress Notes (Signed)
MD on call paged to request continuation of home med Celebrex for patient report of knee pain.

## 2015-03-13 NOTE — ED Provider Notes (Signed)
CSN: [id number]     Arrival date & time 03/13/15  3329 History   First MD Initiated Contact with Patient 03/13/15 [phone]     Chief Complaint  Patient presents with  . Chest Pain     (Consider location/radiation/quality/duration/timing/severity/associated sxs/prior Treatment) HPI Comments: Patient presents today with chest pain.  She began having chest pain around 2:30 AM this morning and has been constant since that time.   He states that he began having pain when he got up to use the bathroom.  He does not think that the pain woke him up from sleep.  He was not doing anything exertional at the onset of pain.  He states that he then sat down and read a book for an hour and his pain improved substantially.  Pain located left anterior chest and does not radiate.  He states that he has never had pain like this before.  He denies any SOB, nausea, vomiting, diaphoresis, cough, hemoptysis, numbness, tingling, dizziness, syncope, or LE pain/swelling.  He denies any prior cardiac history.  No history of HTN, DM, or Hyperlipidemia.   He does not smoke.  He does report that his brother died of a MI at the age of 9.  He He denies every having a Cardiac Cath or Stress Test.  He reports that he saw Dr. Wynonia Lawman with Cardiology 10 years ago, but can not remember what he saw him for.  He denies history of PE or DVT.  Denies prolonged travel or surgeries in the past 4 weeks.    The history is provided by the patient.    Past Medical History  Diagnosis Date  . Prostate cancer    Past Surgical History  Procedure Laterality Date  . Hernia repair    . Knee surgery    . Prostatectomy     No family history on file. History  Substance Use Topics  . Smoking status: Former Research scientist (life sciences)  . Smokeless tobacco: Not on file  . Alcohol Use: Yes    Review of Systems  All other systems reviewed and are negative.     Allergies  Review of patient's allergies indicates no known allergies.  Home Medications   Prior to  Admission medications   Medication Sig Start Date End Date Taking? Authorizing Provider  aspirin EC 81 MG tablet Take 81 mg by mouth daily.   Yes Historical Provider, MD  CALCIUM PO Take 1 tablet by mouth daily.   Yes Historical Provider, MD  celecoxib (CELEBREX) 200 MG capsule Take 200 mg by mouth 2 (two) times daily as needed for moderate pain.    Yes Historical Provider, MD  Cholecalciferol (VITAMIN D PO) Take 1 tablet by mouth daily.   Yes Historical Provider, MD  Glucosamine-Chondroitin (GLUCOSAMINE CHONDR COMPLEX PO) Take 1 tablet by mouth daily.   Yes Historical Provider, MD  Multiple Vitamins-Minerals (MULTIVITAMIN PO) Take 1 tablet by mouth daily.   Yes Historical Provider, MD  Omega-3 Fatty Acids (FISH OIL PO) Take 1 tablet by mouth daily.   Yes Historical Provider, MD   BP 184/79 mmHg  Pulse 60  Temp(Src) 97.8 F (36.6 C) (Oral)  Resp 24  Ht 5\' 10"  (1.778 m)  Wt 190 lb (86.183 kg)  BMI 27.26 kg/m2  SpO2 96% Physical Exam  Constitutional: He appears well-developed and well-nourished.  HENT:  Head: Normocephalic and atraumatic.  Mouth/Throat: Oropharynx is clear and moist.  Neck: Normal range of motion. Neck supple.  Cardiovascular: Normal rate, regular rhythm and normal heart  sounds.   Pulmonary/Chest: Effort normal and breath sounds normal.  Abdominal: Soft. He exhibits no distension and no mass. There is no tenderness. There is no rebound and no guarding.  Musculoskeletal: Normal range of motion.  No LE edema or erythema bilaterally  Neurological: He is alert.  Skin: Skin is warm and dry. He is not diaphoretic.  Psychiatric: He has a normal mood and affect.  Nursing note and vitals reviewed.   ED Course  Procedures (including critical care time) Labs Review Labs Reviewed  CBC WITH DIFFERENTIAL/PLATELET  PROTIME-INR  BASIC METABOLIC PANEL  Randolm Idol, ED    Imaging Review Dg Chest 2 View  03/13/2015   CLINICAL DATA:  Patient woke up at 2 a.m. with Mid  chest pain.  EXAM: CHEST  2 VIEW  COMPARISON:  None.  FINDINGS: Normal heart size and pulmonary vascularity. No focal airspace disease or consolidation in the lungs. No blunting of costophrenic angles. No pneumothorax. Mediastinal contours appear intact. Degenerative changes in the spine.  IMPRESSION: No active cardiopulmonary disease.   Electronically Signed   By: Lucienne Capers M.D.   On: 03/13/2015 05:58     EKG Interpretation   Date/Time:  Thursday March 13 2015 05:20:32 EDT Ventricular Rate:  58 PR Interval:  168 QRS Duration: 109 QT Interval:  430 QTC Calculation: 422 R Axis:   41 Text Interpretation:  Sinus rhythm Borderline T abnormalities, inferior  leads No significant change since last tracing Confirmed by Glynn Octave [phone]) on 03/13/2015 5:23:52 AM     7:45 AM Reassessed patient.  He reports that he is not having any chest pain at this time. MDM   Final diagnoses:  Chest pain   Patient is a 69 year old male who presents today with chest pain onset earlier this morning.  No prior cardiac history.  No ischemic changes on EKG.  Initial troponin negative.  CXR negative.  Patient did recently drive to and from [place].  Therefore, d-dimer was ordered.  Patient with a HEART score of 3-4.  Risk factors of age and family history.  Chest pain resolved while in the ED after given 324 mg ASA.  Patient admitted to [facility] for additional management.      Hyman Bible, PA-C 03/13/15 [place], PA-C 03/13/15 1017

## 2015-03-13 NOTE — ED Notes (Signed)
Pt. reports left chest pain onset this morning , denies SOB , no nausea or diaphoresis . Rates pain at arrival 3/10 .

## 2015-03-13 NOTE — Consult Note (Signed)
Cardiology Consult Note  Admit date: 03/13/2015 Name: Timothy Mendez 69 y.o.  male DOB:  1946-06-16 MRN:  [id number]  Today's date:  03/13/2015  Referring Physician:    [facility]  Primary Physician:    Dr. Michail Sermon  Reason for Consultation:    Chest pain  IMPRESSIONS: 1.  Prolonged episode of chest pain with some features to suggest myocardial ischemia that has resolved with a negative troponin 2.  History of reflux 3.  History of prostate cancer 4.  Elevation of blood pressure without previous diagnosis of hypertension 5.  Positive family history of premature cardiovascular disease  RECOMMENDATION: Patient is currently pain-free.  Second troponin is pending.  Discussed evaluation further and would recommend either myocardial perfusion scanning versus coronary CTA.  After discussion of the risks and benefits of each have elected to proceed with coronary CTA to evaluate ischemia.  In addition this also may allow evaluation of his aorta also.  His d-dimer was negative and the pain was somewhat atypical for pain due to pulmonary embolus.    HISTORY: This very nice 69 year old male has previously been in good health.  He has a history of prostate cancer treated with prostatectomy several years ago.  He is normally active without chest discomfort.  He and his wife returned from a long trip to [place] with a recent car ride last week.  He worked out yesterday doing yard work and was feeling well.  He awoke to go to the bathroom and had left-sided chest discomfort described as a deep pain not indigestion-like on a scale of 3-4 out of 10.  The pain was not pleuritic and not associated with belching or indigestion.  The discomfort persisted for about an hour and then gradually resolved but he decided to come to the emergency room at the urging of his wife.  The pain had moved down to his left lower chest area and then went away in the emergency room.  EKG showed T wave inversions in 3 and  aVF with initial troponin being negative.  EKG was otherwise unremarkable and a chest x-ray was unremarkable.  He normally has no exertional chest discomfort.  He does have a brother who died suddenly at age 61.  He normally denies PND, orthopnea or edema.  Past Medical History  Diagnosis Date  . Prostate cancer   . GERD (gastroesophageal reflux disease)       Past Surgical History  Procedure Laterality Date  . Hernia repair    . Knee surgery      x2  . Prostatectomy      Allergies:  has No Known Allergies.   Medications: Prior to Admission medications   Medication Sig Start Date End Date Taking? Authorizing Provider  aspirin EC 81 MG tablet Take 81 mg by mouth daily.   Yes Historical Provider, MD  CALCIUM PO Take 1 tablet by mouth daily.   Yes Historical Provider, MD  celecoxib (CELEBREX) 200 MG capsule Take 200 mg by mouth 2 (two) times daily as needed for moderate pain.    Yes Historical Provider, MD  Cholecalciferol (VITAMIN D PO) Take 1 tablet by mouth daily.   Yes Historical Provider, MD  Glucosamine-Chondroitin (GLUCOSAMINE CHONDR COMPLEX PO) Take 1 tablet by mouth daily.   Yes Historical Provider, MD  Multiple Vitamins-Minerals (MULTIVITAMIN PO) Take 1 tablet by mouth daily.   Yes Historical Provider, MD  Omega-3 Fatty Acids (FISH OIL PO) Take 1 tablet by mouth daily.   Yes Historical Provider, MD  Family History: Family Status  Relation Status Death Age  . Mother Deceased 18    CHF  . Father Deceased 69    lung cancer  . Brother Deceased 46    sudden death DM  . Sister Alive     Social History:   reports that he has quit smoking. His smoking use included Cigarettes. He has a 5 pack-year smoking history. He has quit using smokeless tobacco. He reports that he drinks alcohol. He reports that he does not use illicit drugs.   History   Social History Narrative    Review of Systems: His weight has been stable.  He has no eye ear nose or throat problems.   Occasional reflux symptoms as well as mild indigestion which is different than the pain that brought him in here.  No abnormal weight loss.  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  Normally tries to stay physically active.  No history of stroke or TIA.  Other than as noted above the remainder of the review of systems is unremarkable.  Physical Exam: BP 149/73 mmHg  Pulse 64  Temp(Src) 97.8 F (36.6 C) (Oral)  Resp 15  Ht 5\' 10"  (1.778 m)  Wt 86.183 kg (190 lb)  BMI 27.26 kg/m2  SpO2 93%  General appearance: Pleasant bearded white male in no acute distress Head: Normocephalic, without obvious abnormality, atraumatic, Balding male hair pattern Eyes: conjunctivae/corneas clear. PERRL, EOM's intact. Fundi benign. Neck: no adenopathy, no carotid bruit, no JVD and supple, symmetrical, trachea midline Lungs: clear to auscultation bilaterally Heart: Regular rate and rhythm, normal S1 and S2, no S3, 8-5/2 early systolic murmur heard over the aortic valve, precordium quiet Abdomen: soft, non-tender; bowel sounds normal; no masses,  no organomegaly Rectal: deferred Extremities: extremities normal, atraumatic, no cyanosis or edema Pulses: 2+ and symmetric Skin: Skin color, texture, turgor normal. No rashes or lesions Neurologic: Grossly normal  Labs: CBC  Recent Labs  03/13/15 0524  WBC 6.5  RBC 4.79  HGB 15.6  HCT 44.8  PLT 191  MCV 93.5  MCH 32.6  MCHC 34.8  RDW 12.3  LYMPHSABS 1.3  MONOABS 0.7  EOSABS 0.2  BASOSABS 0.0   CMP   Recent Labs  03/13/15 0524  NA 139  K 3.9  CL 101  CO2 31  GLUCOSE 106*  BUN 18  CREATININE 1.02  CALCIUM 9.3  GFRNONAA 72*  GFRAA 84*   Cardiac Panel (last 3 results) Troponin (Point of Care Test)  Recent Labs  03/13/15 0526  TROPIPOC 0.00    Radiology: No active cardiopulmonary disease, degenerative changes of spine  EKG: No change since previous tracing, inferior T wave inversions in 3 and  aVF  Signed:  W. Doristine Church MD [facility]   Cardiology Consultant  03/13/2015, 9:15 AM

## 2015-03-14 ENCOUNTER — Encounter (HOSPITAL_COMMUNITY): Payer: Self-pay | Admitting: General Practice

## 2015-03-14 ENCOUNTER — Encounter (HOSPITAL_COMMUNITY)
Admission: EM | Disposition: A | Discharge status: Home / Self Care | Payer: Medicare Other | Source: Home / Self Care | Attending: Internal Medicine

## 2015-03-14 DIAGNOSIS — I25119 Atherosclerotic heart disease of native coronary artery with unspecified angina pectoris: Secondary | ICD-10-CM | POA: Diagnosis not present

## 2015-03-14 DIAGNOSIS — I25110 Atherosclerotic heart disease of native coronary artery with unstable angina pectoris: Secondary | ICD-10-CM | POA: Diagnosis not present

## 2015-03-14 DIAGNOSIS — Z9079 Acquired absence of other genital organ(s): Secondary | ICD-10-CM | POA: Diagnosis present

## 2015-03-14 DIAGNOSIS — Z8546 Personal history of malignant neoplasm of prostate: Secondary | ICD-10-CM | POA: Diagnosis not present

## 2015-03-14 DIAGNOSIS — R079 Chest pain, unspecified: Secondary | ICD-10-CM | POA: Insufficient documentation

## 2015-03-14 DIAGNOSIS — Z8249 Family history of ischemic heart disease and other diseases of the circulatory system: Secondary | ICD-10-CM | POA: Diagnosis not present

## 2015-03-14 DIAGNOSIS — K219 Gastro-esophageal reflux disease without esophagitis: Secondary | ICD-10-CM | POA: Diagnosis present

## 2015-03-14 DIAGNOSIS — Z7982 Long term (current) use of aspirin: Secondary | ICD-10-CM | POA: Diagnosis not present

## 2015-03-14 DIAGNOSIS — Z79899 Other long term (current) drug therapy: Secondary | ICD-10-CM | POA: Diagnosis not present

## 2015-03-14 DIAGNOSIS — M25569 Pain in unspecified knee: Secondary | ICD-10-CM | POA: Diagnosis present

## 2015-03-14 DIAGNOSIS — I200 Unstable angina: Secondary | ICD-10-CM

## 2015-03-14 DIAGNOSIS — I10 Essential (primary) hypertension: Secondary | ICD-10-CM

## 2015-03-14 DIAGNOSIS — Z87891 Personal history of nicotine dependence: Secondary | ICD-10-CM | POA: Diagnosis not present

## 2015-03-14 DIAGNOSIS — E785 Hyperlipidemia, unspecified: Secondary | ICD-10-CM | POA: Diagnosis present

## 2015-03-14 HISTORY — PX: LEFT HEART CATHETERIZATION WITH CORONARY ANGIOGRAM: SHX5451

## 2015-03-14 HISTORY — PX: CARDIAC CATHETERIZATION: SHX172

## 2015-03-14 HISTORY — PX: CORONARY ANGIOPLASTY WITH STENT PLACEMENT: SHX49

## 2015-03-14 LAB — LIPID PANEL
Cholesterol: 137 mg/dL (ref 0–200)
HDL: 29 mg/dL — ABNORMAL LOW (ref >39)
LDL Cholesterol: 88 mg/dL (ref 0–99)
Total CHOL/HDL Ratio: 4.7 RATIO
Triglycerides: 99 mg/dL (ref <150)
VLDL: 20 mg/dL (ref 0–40)

## 2015-03-14 LAB — CBC
HCT: 40.6 % (ref 39.0–52.0)
Hemoglobin: 13.9 g/dL (ref 13.0–17.0)
MCH: 31.8 pg (ref 26.0–34.0)
MCHC: 34.2 g/dL (ref 30.0–36.0)
MCV: 92.9 fL (ref 78.0–100.0)
PLATELETS: 180 10*3/uL (ref 150–400)
RBC: 4.37 MIL/uL (ref 4.22–5.81)
RDW: 12.3 % (ref 11.5–15.5)
WBC: 5.3 10*3/uL (ref 4.0–10.5)

## 2015-03-14 LAB — BASIC METABOLIC PANEL
Anion gap: 10 (ref 5–15)
BUN: 15 mg/dL (ref 6–23)
CHLORIDE: 104 mmol/L (ref 96–112)
CO2: 27 mmol/L (ref 19–32)
CREATININE: 1.09 mg/dL (ref 0.50–1.35)
Calcium: 9.2 mg/dL (ref 8.4–10.5)
GFR calc Af Amer: 77 mL/min — ABNORMAL LOW (ref >90)
GFR calc non Af Amer: 67 mL/min — ABNORMAL LOW (ref >90)
GLUCOSE: 106 mg/dL — AB (ref 70–99)
POTASSIUM: 4.3 mmol/L (ref 3.5–5.1)
Sodium: 141 mmol/L (ref 135–145)

## 2015-03-14 LAB — PLATELET COUNT: Platelets: 185 10*3/uL (ref 150–400)

## 2015-03-14 LAB — POCT ACTIVATED CLOTTING TIME
ACTIVATED CLOTTING TIME: 270 s
Activated Clotting Time: 233 seconds

## 2015-03-14 SURGERY — LEFT HEART CATHETERIZATION WITH CORONARY ANGIOGRAM
Anesthesia: LOCAL

## 2015-03-14 MED ORDER — NITROGLYCERIN 1 MG/10 ML FOR IR/CATH LAB
INTRA_ARTERIAL | Status: AC
  Filled 2015-03-14: qty 10

## 2015-03-14 MED ORDER — HEPARIN SODIUM (PORCINE) 1000 UNIT/ML IJ SOLN
INTRAMUSCULAR | Status: AC
  Filled 2015-03-14: qty 1

## 2015-03-14 MED ORDER — VERAPAMIL HCL 2.5 MG/ML IV SOLN
INTRAVENOUS | Status: AC
  Filled 2015-03-14: qty 2

## 2015-03-14 MED ORDER — MIDAZOLAM HCL 2 MG/2ML IJ SOLN
INTRAMUSCULAR | Status: AC
  Filled 2015-03-14: qty 2

## 2015-03-14 MED ORDER — TIROFIBAN HCL IV 5 MG/100ML
0.1500 ug/kg/min | INTRAVENOUS | Status: AC
  Filled 2015-03-14 (×2): qty 100

## 2015-03-14 MED ORDER — CLOPIDOGREL BISULFATE 75 MG PO TABS
75.0000 mg | ORAL_TABLET | Freq: Every day | ORAL | Status: DC
  Administered 2015-03-15: 09:00:00 75 mg via ORAL
  Filled 2015-03-14: qty 1

## 2015-03-14 MED ORDER — ASPIRIN EC 81 MG PO TBEC: 81.0000 mg | DELAYED_RELEASE_TABLET | Freq: Every day | ORAL | Status: DC

## 2015-03-14 MED ORDER — ACETAMINOPHEN 325 MG PO TABS: 650.0000 mg | ORAL_TABLET | ORAL | Status: DC | PRN

## 2015-03-14 MED ORDER — HEPARIN (PORCINE) IN NACL 2-0.9 UNIT/ML-% IJ SOLN
INTRAMUSCULAR | Status: AC
  Filled 2015-03-14: qty 1000

## 2015-03-14 MED ORDER — ASPIRIN 81 MG PO CHEW: 81.0000 mg | CHEWABLE_TABLET | Freq: Every day | ORAL | Status: DC

## 2015-03-14 MED ORDER — ONDANSETRON HCL 4 MG/2ML IJ SOLN: 4.0000 mg | Freq: Four times a day (QID) | INTRAMUSCULAR | Status: DC | PRN

## 2015-03-14 MED ORDER — SODIUM CHLORIDE 0.9 % IV SOLN: INTRAVENOUS | Status: AC

## 2015-03-14 MED ORDER — LIDOCAINE HCL (PF) 1 % IJ SOLN
INTRAMUSCULAR | Status: AC
  Filled 2015-03-14: qty 30

## 2015-03-14 MED ORDER — FENTANYL CITRATE 0.05 MG/ML IJ SOLN
INTRAMUSCULAR | Status: AC
  Filled 2015-03-14: qty 2

## 2015-03-14 MED ORDER — CLOPIDOGREL BISULFATE 300 MG PO TABS
ORAL_TABLET | ORAL | Status: AC
  Filled 2015-03-14: qty 1

## 2015-03-14 MED ORDER — TIROFIBAN HCL IV 5 MG/100ML
INTRAVENOUS | Status: AC
  Filled 2015-03-14: qty 100

## 2015-03-14 MED ORDER — ANGIOPLASTY BOOK
Freq: Once | Status: AC
  Administered 2015-03-14: 23:00:00
  Filled 2015-03-14: qty 1

## 2015-03-14 NOTE — CV Procedure (Signed)
PROCEDURE:  Left heart catheterization with selective coronary angiography, left ventriculogram.  PCI RCA.  INDICATIONS:   Unstable angina  The risks, benefits, and details of the procedure were explained to the patient.  The patient verbalized understanding and wanted to proceed.  Informed written consent was obtained.  PROCEDURE TECHNIQUE:  After Xylocaine anesthesia a 72F slender sheath was placed in the right radial artery with a single anterior needle wall stick.   IV Heparin was given.  Right coronary angiography was done using a Judkins R4 guide catheter.  Left coronary angiography was done using a Judkins L3.5 guide catheter.  Left ventriculography was done using a pigtail catheter.  A TR band was used for hemostasis.   CONTRAST:  Total of 100 cc.  COMPLICATIONS:  None.    HEMODYNAMICS:  Aortic pressure was 125/60; LV pressure was 134/3; LVEDP 22.  There was no gradient between the left ventricle and aorta.    ANGIOGRAPHIC DATA:   The left main coronary artery is widely patent  The left anterior descending artery is is a large vessel which wraps around the apex. In the mid vessel, there is mild to moderate diffuse atherosclerosis which is calcific. There are multiple small diagonals which are patent.  The left circumflex artery is a large vessel. There is mild atherosclerosis diffusely. There is a large first obtuse marginal which is widely patent.  The right coronary artery is a large, dominant vessel. In the mid vessel, there is a diffuse 80% stenosis. The posterior lateral artery is medium sized and widely patent. The posterior descending artery has mild disease in the mid vessel. There is an ectatic segment just before the bifurcation.  LEFT VENTRICULOGRAM:  Left ventricular angiogram was done in the 30 RAO projection and revealed normal left ventricular wall motion and systolic function with an estimated ejection fraction of 60%.  LVEDP was 22 mmHg.  PCI NARRATIVE: A  JR4 guiding catheter was used to engage the RCA. A pro-water wire was placed across the area disease in the RCA. A 2.5 x 15 balloon was used to predilate with several dilations. A 3.0 x 38 Promus drug-eluting stent was deployed at high pressure. The stent was postdilated with a 3.5 x 20 noncompliant balloon up to 18 atm. There was no residual stenosis. TIMI-3 flow was maintained throughout. There was an excellent angiographic result. The patient tolerated the procedure well.  IMPRESSIONS:  1.  Widely patentft main coronary artery. 2. 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 3. Mild disease in the  left circumflex artery and its branches. 4. 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  This was successfully treated with a 3.0 x 38 drug-eluting stent, postdilated to greater than 3.5 mm in diameter.  5. Normal left ventricular systolic function.  LVEDP 22 mmHg.  Ejection fraction 60%.  RECOMMENDATION:  Continue dual antiplatelet therapy for at least a year. He needs aggressive secondary prevention as well. We'll watch overnight. Likely discharge tomorrow. Stressed the importance of dual antiplatelet therapy.

## 2015-03-14 NOTE — Progress Notes (Signed)
UR completed 

## 2015-03-14 NOTE — Progress Notes (Signed)
[facility] PROGRESS NOTE  Timothy Mendez YSA:[id number] DOB: 16-Jun-1946 DOA: 03/13/2015 PCP: Dorian Heckle, MD  Assessment/Plan: #1 chest pain/unstable angina Patient with complaints of chest pain. Cardiac enzymes have been negative 3. Fasting lipid panel with LDL of 88. Chest x-ray was negative. Patient had CT coronary done yesterday, which showed a coronary calcium score of 12/14/2004. Several diffuse calcification of all 3 coronary arteries with 2 vessel coronary artery disease in the mid RCA and mid LAD. Moderate concentric LVH. Continue aspirin, Lipitor, Lopressor. Patient for cardiac catheterization today. Cardiology following and appreciate input and recommendations.  #2 hyperlipidemia LDL of 88. Continue Lipitor.  #3 hypertension Stable. Continue Lopressor.  #4 history of prostate cancer status post prostatectomy in 2002 Outpatient follow-up.  #5 coronary artery disease Per cardiac CT. Continue aspirin, Lipitor, Lopressor. Patient for cardiac catheterization today.  6 prophylaxis Lovenox for DVT prophylaxis.  Code Status: Full Family Communication: Updated patient and wife at bedside. Disposition Plan: Pending cardiac catheterization.   Consultants:  Cardiology: Dr. Wynonia Lawman 03/13/2015  Procedures:  Chest x-ray 03/13/2015  CT coronary 03/13/2015  Antibiotics:  None  HPI/Subjective: Patient denies current CP.  Objective: Filed Vitals:   03/14/15 1152  BP: 128/54  Pulse: 51  Temp: 98.4 F (36.9 C)  Resp: 18    Intake/Output Summary (Last 24 hours) at 03/14/15 1517 Last data filed at 03/14/15 0958  Gross per 24 hour  Intake    243 ml  Output   1250 ml  Net  -1007 ml   Filed Weights   03/13/15 0522 03/14/15 0500  Weight: 86.183 kg (190 lb) 88.8 kg (195 lb 12.3 oz)    Exam:   General:  NAD  Cardiovascular: RRR  Respiratory: CTAB  Abdomen: soft/NT/ND/+BS  Musculoskeletal: No c/c/e  Data Reviewed: Basic Metabolic  Panel:  Recent Labs Lab 03/13/15 0524 03/14/15 0403  NA 139 141  K 3.9 4.3  CL 101 104  CO2 31 27  GLUCOSE 106* 106*  BUN 18 15  CREATININE 1.02 1.09  CALCIUM 9.3 9.2   Liver Function Tests: No results for input(s): AST, ALT, ALKPHOS, BILITOT, PROT, ALBUMIN in the last 168 hours. No results for input(s): LIPASE, AMYLASE in the last 168 hours. No results for input(s): AMMONIA in the last 168 hours. CBC:  Recent Labs Lab 03/13/15 0524 03/14/15 0403  WBC 6.5 5.3  NEUTROABS 4.2  --   HGB 15.6 13.9  HCT 44.8 40.6  MCV 93.5 92.9  PLT 191 180   Cardiac Enzymes:  Recent Labs Lab 03/13/15 0916 03/13/15 1339 03/13/15 1519  TROPONINI <0.03 <0.03 <0.03   BNP (last 3 results) No results for input(s): BNP in the last 8760 hours.  ProBNP (last 3 results) No results for input(s): PROBNP in the last 8760 hours.  CBG: No results for input(s): GLUCAP in the last 168 hours.  No results found for this or any previous visit (from the past 240 hour(s)).   Studies: Dg Chest 2 View  03/13/2015   CLINICAL DATA:  Patient woke up at 2 a.m. with Mid chest pain.  EXAM: CHEST  2 VIEW  COMPARISON:  None.  FINDINGS: Normal heart size and pulmonary vascularity. No focal airspace disease or consolidation in the lungs. No blunting of costophrenic angles. No pneumothorax. Mediastinal contours appear intact. Degenerative changes in the spine.  IMPRESSION: No active cardiopulmonary disease.   Electronically Signed   By: Lucienne Capers M.D.   On: 03/13/2015 05:58   Ct Coronary Morp W/cta Cor W/score  W/ca W/cm &/or Wo/cm  03/13/2015   ADDENDUM REPORT: 03/13/2015 16:39  CLINICAL DATA:  69 year old male with chest pain.  EXAM: Cardiac/Coronary  CT  TECHNIQUE: The patient was scanned on a Philips 256 scanner.  FINDINGS: A 120 kV prospective scan was triggered in the descending thoracic aorta at 111 HU's. Axial non-contrast 3 mm slices were carried out through the heart. The data set was analyzed on a  dedicated work station and scored using the [place]. Gantry rotation speed was 270 msecs and collimation was .9 mm. No beta blockade and 0.8 mg of sl NTG was given. The 3D data set was reconstructed in 5% intervals of the 67-82 % of the R-R cycle. Diastolic phases were analyzed on a dedicated work station using MPR, MIP and VRT modes. The patient received 80 cc of contrast.  Aorta:  Normal caliber, no dissection.  Aortic Valve:  Trileaflet, no calcifications.  Coronary Arteries:  Normal origin.  Right dominance.  Left main is a large vessel with minimal non-calcified plaque.  LAD is a large and long vessel that wraps around the apex and gives rise to very small diagonal branches. Proximal and mid LAD has severe long circumferential calcifications mixed with non-calcified plaque with 50-70% stenosis and possible > 70% stenosis in mid LAD. Distal LAD has diffuse moderate calcifications.  LCX is a nondominant vessel that gives rise to 2 obtuse marginal branches. LCX has multiple mild calcified plaques in its proximal segment associated with 0-25% stenosis.  OM1 is a small vessel that has diffuse calcifications and focal > 70% stenosis.  OM2 is a large vessel that further subbranches and has multiple mild calcified plaques with associated stenosis 0-25%.  RCA is a large caliber dominant vessel that has diffuse mixed plaque with significant diffuse calcifications.  There is mild noncalcified plaque in the ostium associated with 25-50% stenosis, followed by calcified 0-25% stenosis.  Mid RCA has complex mixed plaque with severe > 70 stenosis.  Distal RCA has only mild diffuse plaque.  Proximal PDA has moderate calcified 51-70% stenosis.  Other findings:  Moderate concentric LVH.  Large left atrial appendage without filling defect.  No ASD seen.  Normal drainage of the pulmonary veins.  IMPRESSION: 1. Coronary calcium score of 1906 (LAD 1180, RCA 438, LCX 288). This was 19 percentile for age and sex matched control.   2.  Normal coronary origin, right dominance.  3. There is severe diffuse calcification of all three coronary arteries with two vessel CAD in mid RCA and mid LAD.  Cardiac catheterization is recommended.  4.  Moderate concentric left ventricular hypertrophy.  Timothy Mendez   Electronically Signed   By: Timothy Mendez   On: 03/13/2015 16:39   03/13/2015   EXAM: OVER-READ INTERPRETATION  CT CHEST  The following report is an over-read performed by radiologist Dr. Rebekah Chesterfield [facility] on 03/13/2015. This over-read does not include interpretation of cardiac or coronary anatomy or pathology. The coronary calcium score/coronary CTA interpretation by the cardiologist is attached.  COMPARISON:  No priors.  FINDINGS: 4 mm subpleural nodule in the right middle lobe abutting the minor fissure (image 27 of series 204). No other larger more suspicious appearing pulmonary nodules or masses are noted in the visualized portions of the thorax. Within the visualized thorax there is no acute consolidative airspace disease, no pleural effusions, no pneumothorax and no lymphadenopathy. 1 cm cyst in segment 2 of the liver. Small gastric diverticulum from the cardia of the  stomach. Visualized portions of the upper abdomen are otherwise unremarkable. There are no aggressive appearing lytic or blastic lesions noted in the visualized portions of the skeleton.  IMPRESSION: 1. 4 mm subpleural nodule in the right middle lobe abutting the minor fissure. Statistically, this is likely to represent a subpleural lymph node, however, this is nonspecific. If the patient is at high risk for bronchogenic carcinoma, follow-up chest CT at 1 year is recommended. If the patient is at low risk, no follow-up is needed. This recommendation follows the consensus statement: Guidelines for Management of Small Pulmonary Nodules Detected on CT Scans: A Statement from the [place] as published in Radiology 2005; 237:395-400. 2.  Additional incidental findings, as above.  Electronically Signed: By: Vinnie Langton M.D. On: 03/13/2015 13:57    Scheduled Meds: . aspirin EC  81 mg Oral Daily  . atorvastatin  40 mg Oral q1800  . enoxaparin (LOVENOX) injection  40 mg Subcutaneous Q24H  . metoprolol tartrate  25 mg Oral BID  . sodium chloride  3 mL Intravenous Q12H   Continuous Infusions: . sodium chloride 1 mL/kg/hr (03/14/15 0741)    Principal Problem:   Unstable angina pectoris Active Problems:   Hypertension   CAD (coronary artery disease), native coronary artery   History of prostate cancer    Time spent: 72 mins    [facility] MD [facility] Pager [phone]. If 7PM-7AM, please contact night-coverage at [email or web address], password [email or web address] 03/14/2015, 3:17 PM

## 2015-03-14 NOTE — H&P (View-Only) (Signed)
Cardiology Consult Note  Admit date: 03/13/2015 Name: Timothy Mendez 69 y.o.  male DOB:  1946/06/25 MRN:  [id number]  Today's date:  03/13/2015  Referring Physician:    [facility]  Primary Physician:    Dr. Michail Sermon  Reason for Consultation:    Chest pain  IMPRESSIONS: 1.  Prolonged episode of chest pain with some features to suggest myocardial ischemia that has resolved with a negative troponin 2.  History of reflux 3.  History of prostate cancer 4.  Elevation of blood pressure without previous diagnosis of hypertension 5.  Positive family history of premature cardiovascular disease  RECOMMENDATION: Patient is currently pain-free.  Second troponin is pending.  Discussed evaluation further and would recommend either myocardial perfusion scanning versus coronary CTA.  After discussion of the risks and benefits of each have elected to proceed with coronary CTA to evaluate ischemia.  In addition this also may allow evaluation of his aorta also.  His d-dimer was negative and the pain was somewhat atypical for pain due to pulmonary embolus.    HISTORY: This very nice 69 year old male has previously been in good health.  He has a history of prostate cancer treated with prostatectomy several years ago.  He is normally active without chest discomfort.  He and his wife returned from a long trip to [place] with a recent car ride last week.  He worked out yesterday doing yard work and was feeling well.  He awoke to go to the bathroom and had left-sided chest discomfort described as a deep pain not indigestion-like on a scale of 3-4 out of 10.  The pain was not pleuritic and not associated with belching or indigestion.  The discomfort persisted for about an hour and then gradually resolved but he decided to come to the emergency room at the urging of his wife.  The pain had moved down to his left lower chest area and then went away in the emergency room.  EKG showed T wave inversions in 3 and  aVF with initial troponin being negative.  EKG was otherwise unremarkable and a chest x-ray was unremarkable.  He normally has no exertional chest discomfort.  He does have a brother who died suddenly at age 50.  He normally denies PND, orthopnea or edema.  Past Medical History  Diagnosis Date  . Prostate cancer   . GERD (gastroesophageal reflux disease)       Past Surgical History  Procedure Laterality Date  . Hernia repair    . Knee surgery      x2  . Prostatectomy      Allergies:  has No Known Allergies.   Medications: Prior to Admission medications   Medication Sig Start Date End Date Taking? Authorizing Provider  aspirin EC 81 MG tablet Take 81 mg by mouth daily.   Yes Historical Provider, MD  CALCIUM PO Take 1 tablet by mouth daily.   Yes Historical Provider, MD  celecoxib (CELEBREX) 200 MG capsule Take 200 mg by mouth 2 (two) times daily as needed for moderate pain.    Yes Historical Provider, MD  Cholecalciferol (VITAMIN D PO) Take 1 tablet by mouth daily.   Yes Historical Provider, MD  Glucosamine-Chondroitin (GLUCOSAMINE CHONDR COMPLEX PO) Take 1 tablet by mouth daily.   Yes Historical Provider, MD  Multiple Vitamins-Minerals (MULTIVITAMIN PO) Take 1 tablet by mouth daily.   Yes Historical Provider, MD  Omega-3 Fatty Acids (FISH OIL PO) Take 1 tablet by mouth daily.   Yes Historical Provider, MD  Family History: Family Status  Relation Status Death Age  . Mother Deceased 36    CHF  . Father Deceased 53    lung cancer  . Brother Deceased 26    sudden death DM  . Sister Alive     Social History:   reports that he has quit smoking. His smoking use included Cigarettes. He has a 5 pack-year smoking history. He has quit using smokeless tobacco. He reports that he drinks alcohol. He reports that he does not use illicit drugs.   History   Social History Narrative    Review of Systems: His weight has been stable.  He has no eye ear nose or throat problems.   Occasional reflux symptoms as well as mild indigestion which is different than the pain that brought him in here.  No abnormal weight loss.  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  Normally tries to stay physically active.  No history of stroke or TIA.  Other than as noted above the remainder of the review of systems is unremarkable.  Physical Exam: BP 149/73 mmHg  Pulse 64  Temp(Src) 97.8 F (36.6 C) (Oral)  Resp 15  Ht 5\' 10"  (1.778 m)  Wt 86.183 kg (190 lb)  BMI 27.26 kg/m2  SpO2 93%  General appearance: Pleasant bearded white male in no acute distress Head: Normocephalic, without obvious abnormality, atraumatic, Balding male hair pattern Eyes: conjunctivae/corneas clear. PERRL, EOM's intact. Fundi benign. Neck: no adenopathy, no carotid bruit, no JVD and supple, symmetrical, trachea midline Lungs: clear to auscultation bilaterally Heart: Regular rate and rhythm, normal S1 and S2, no S3, 9-9/3 early systolic murmur heard over the aortic valve, precordium quiet Abdomen: soft, non-tender; bowel sounds normal; no masses,  no organomegaly Rectal: deferred Extremities: extremities normal, atraumatic, no cyanosis or edema Pulses: 2+ and symmetric Skin: Skin color, texture, turgor normal. No rashes or lesions Neurologic: Grossly normal  Labs: CBC  Recent Labs  03/13/15 0524  WBC 6.5  RBC 4.79  HGB 15.6  HCT 44.8  PLT 191  MCV 93.5  MCH 32.6  MCHC 34.8  RDW 12.3  LYMPHSABS 1.3  MONOABS 0.7  EOSABS 0.2  BASOSABS 0.0   CMP   Recent Labs  03/13/15 0524  NA 139  K 3.9  CL 101  CO2 31  GLUCOSE 106*  BUN 18  CREATININE 1.02  CALCIUM 9.3  GFRNONAA 72*  GFRAA 84*   Cardiac Panel (last 3 results) Troponin (Point of Care Test)  Recent Labs  03/13/15 0526  TROPIPOC 0.00    Radiology: No active cardiopulmonary disease, degenerative changes of spine  EKG: No change since previous tracing, inferior T wave inversions in 3 and  aVF  Signed:  W. Doristine Church MD [facility]   Cardiology Consultant  03/13/2015, 9:15 AM

## 2015-03-14 NOTE — Interval H&P Note (Signed)
Cath Lab Visit (complete for each Cath Lab visit)  Clinical Evaluation Leading to the Procedure:   ACS: Yes.    Non-ACS:    Anginal Classification: CCS IV  Anti-ischemic medical therapy: Minimal Therapy (1 class of medications)  Non-Invasive Test Results: No non-invasive testing performed  Prior CABG: No previous CABG  TIMI SCORE  Patient Information:  TIMI Score is 4  UA/NSTEMI and intermediate-risk features (e.g., TIMI score 3?4) for short-term risk of death or nonfatal MI  Revascularization of the presumed culprit artery   A (8)  Indication: 10; Score: 8     History and Physical Interval Note:  03/14/2015 4:06 PM  Collene Schlichter  has presented today for surgery, with the diagnosis of cp  The various methods of treatment have been discussed with the patient and family. After consideration of risks, benefits and other options for treatment, the patient has consented to  Procedure(s): LEFT HEART CATHETERIZATION WITH CORONARY ANGIOGRAM (N/A) as a surgical intervention .  The patient's history has been reviewed, patient examined, no change in status, stable for surgery.  I have reviewed the patient's chart and labs.  Questions were answered to the patient's satisfaction.     VARANASI,JAYADEEP S.

## 2015-03-15 DIAGNOSIS — I25119 Atherosclerotic heart disease of native coronary artery with unspecified angina pectoris: Secondary | ICD-10-CM | POA: Insufficient documentation

## 2015-03-15 LAB — CBC
HCT: 39.5 % (ref 39.0–52.0)
Hemoglobin: 13.4 g/dL (ref 13.0–17.0)
MCH: 31.7 pg (ref 26.0–34.0)
MCHC: 33.9 g/dL (ref 30.0–36.0)
MCV: 93.4 fL (ref 78.0–100.0)
Platelets: 176 10*3/uL (ref 150–400)
RBC: 4.23 MIL/uL (ref 4.22–5.81)
RDW: 12.5 % (ref 11.5–15.5)
WBC: 6.7 10*3/uL (ref 4.0–10.5)

## 2015-03-15 LAB — BASIC METABOLIC PANEL
Anion gap: 13 (ref 5–15)
BUN: 11 mg/dL (ref 6–23)
CO2: 23 mmol/L (ref 19–32)
Calcium: 8.9 mg/dL (ref 8.4–10.5)
Chloride: 103 mmol/L (ref 96–112)
Creatinine, Ser: 1.04 mg/dL (ref 0.50–1.35)
GFR calc Af Amer: 82 mL/min — ABNORMAL LOW (ref >90)
GFR calc non Af Amer: 71 mL/min — ABNORMAL LOW (ref >90)
Glucose, Bld: 94 mg/dL (ref 70–99)
POTASSIUM: 3.7 mmol/L (ref 3.5–5.1)
Sodium: 139 mmol/L (ref 135–145)

## 2015-03-15 MED ORDER — ATORVASTATIN CALCIUM 40 MG PO TABS: 40.0000 mg | ORAL_TABLET | Freq: Every day | ORAL | Status: DC

## 2015-03-15 MED ORDER — CLOPIDOGREL BISULFATE 75 MG PO TABS: 75.0000 mg | ORAL_TABLET | Freq: Every day | ORAL | Status: DC

## 2015-03-15 MED ORDER — METOPROLOL TARTRATE 25 MG PO TABS
25.0000 mg | ORAL_TABLET | Freq: Two times a day (BID) | ORAL | Status: DC
Start: 2015-03-15 — End: 2016-07-07

## 2015-03-15 NOTE — Progress Notes (Addendum)
CARDIAC REHAB PHASE I   PRE:  Rate/Rhythm: 62 sinus rhythm  BP:  Supine:   Sitting: 149/66  Standing:    SaO2: 96% ra  MODE:  Ambulation: 800 ft   POST:  Rate/Rhythem: 66 sinus rhythm  BP:  Supine:   Sitting: 156/73  Standing:    SaO2: 95% ra    810-850 Pt ambulated in hallway x1 assist without difficulty.  Steady gait, asymptomatic.  Education completed, including low fat diet, exercise, medication compliance. Pt and wife verbalized understanding. Pt oriented to CRP II, at pt request  referral will be sent to [facility].   Adrian Dinovo, [place]

## 2015-03-15 NOTE — Progress Notes (Signed)
Patient ID: Timothy Mendez, male   DOB: 03-14-1946, 69 y.o.   MRN: [id number]    Subjective:  Denies SSCP, palpitations or Dyspnea Primary cardiologist Wynonia Lawman  Objective:  Filed Vitals:   03/14/15 2347 03/15/15 0100 03/15/15 0616 03/15/15 0757  BP: 147/60  137/65 143/70  Pulse: 50  53 63  Temp: 97.8 F (36.6 C)  98 F (36.7 C) 98.3 F (36.8 C)  TempSrc: Oral  Oral Oral  Resp: 18  20 20   Height:      Weight:  194 lb 3.6 oz (88.1 kg)    SpO2: 93%  93% 96%    Intake/Output from previous day:  Intake/Output Summary (Last 24 hours) at 03/15/15 0810 Last data filed at 03/15/15 0758  Gross per 24 hour  Intake 1929.89 ml  Output   2000 ml  Net -70.11 ml    Physical Exam: Affect appropriate Healthy:  appears stated age HEENT: normal Neck supple with no adenopathy JVP normal no bruits no thyromegaly Lungs clear with no wheezing and good diaphragmatic motion Heart:  S1/S2 no murmur, no rub, gallop or click PMI normal Abdomen: benighn, BS positve, no tenderness, no AAA no bruit.  No HSM or HJR Distal pulses intact with no bruits  Right radial A no hematoma  No edema Neuro non-focal Skin warm and dry No muscular weakness   Lab Results: Basic Metabolic Panel:  Recent Labs  03/14/15 0403 03/15/15 0403  NA 141 139  K 4.3 3.7  CL 104 103  CO2 27 23  GLUCOSE 106* 94  BUN 15 11  CREATININE 1.09 1.04  CALCIUM 9.2 8.9   CBC:  Recent Labs  03/13/15 0524 03/14/15 0403 03/14/15 2304 03/15/15 0403  WBC 6.5 5.3  --  6.7  NEUTROABS 4.2  --   --   --   HGB 15.6 13.9  --  13.4  HCT 44.8 40.6  --  39.5  MCV 93.5 92.9  --  93.4  PLT 191 180 185 176   Cardiac Enzymes:  Recent Labs  03/13/15 0916 03/13/15 1339 03/13/15 1519  TROPONINI <0.03 <0.03 <0.03   BNP: Invalid input(s): POCBNP D-Dimer:  Recent Labs  03/13/15 0524  DDIMER 0.32   Fasting Lipid Panel:  Recent Labs  03/14/15 0403  CHOL 137  HDL 29*  LDLCALC 88  TRIG 99  CHOLHDL 4.7     Imaging: Ct Coronary Morp W/cta Cor W/score W/ca W/cm &/or Wo/cm  03/13/2015   ADDENDUM REPORT: 03/13/2015 16:39  CLINICAL DATA:  69 year old male with chest pain.  EXAM: Cardiac/Coronary  CT  TECHNIQUE: The patient was scanned on a Philips 256 scanner.  FINDINGS: A 120 kV prospective scan was triggered in the descending thoracic aorta at 111 HU's. Axial non-contrast 3 mm slices were carried out through the heart. The data set was analyzed on a dedicated work station and scored using the [place]. Gantry rotation speed was 270 msecs and collimation was .9 mm. No beta blockade and 0.8 mg of sl NTG was given. The 3D data set was reconstructed in 5% intervals of the 67-82 % of the R-R cycle. Diastolic phases were analyzed on a dedicated work station using MPR, MIP and VRT modes. The patient received 80 cc of contrast.  Aorta:  Normal caliber, no dissection.  Aortic Valve:  Trileaflet, no calcifications.  Coronary Arteries:  Normal origin.  Right dominance.  Left main is a large vessel with minimal non-calcified plaque.  LAD is a large and  long vessel that wraps around the apex and gives rise to very small diagonal branches. Proximal and mid LAD has severe long circumferential calcifications mixed with non-calcified plaque with 50-70% stenosis and possible > 70% stenosis in mid LAD. Distal LAD has diffuse moderate calcifications.  LCX is a nondominant vessel that gives rise to 2 obtuse marginal branches. LCX has multiple mild calcified plaques in its proximal segment associated with 0-25% stenosis.  OM1 is a small vessel that has diffuse calcifications and focal > 70% stenosis.  OM2 is a large vessel that further subbranches and has multiple mild calcified plaques with associated stenosis 0-25%.  RCA is a large caliber dominant vessel that has diffuse mixed plaque with significant diffuse calcifications.  There is mild noncalcified plaque in the ostium associated with 25-50% stenosis, followed by calcified  0-25% stenosis.  Mid RCA has complex mixed plaque with severe > 70 stenosis.  Distal RCA has only mild diffuse plaque.  Proximal PDA has moderate calcified 51-70% stenosis.  Other findings:  Moderate concentric LVH.  Large left atrial appendage without filling defect.  No ASD seen.  Normal drainage of the pulmonary veins.  IMPRESSION: 1. Coronary calcium score of 1906 (LAD 1180, RCA 438, LCX 288). This was 54 percentile for age and sex matched control.  2.  Normal coronary origin, right dominance.  3. There is severe diffuse calcification of all three coronary arteries with two vessel CAD in mid RCA and mid LAD.  Cardiac catheterization is recommended.  4.  Moderate concentric left ventricular hypertrophy.  Ena Dawley   Electronically Signed   By: Ena Dawley   On: 03/13/2015 16:39   03/13/2015   EXAM: OVER-READ INTERPRETATION  CT CHEST  The following report is an over-read performed by radiologist Dr. Rebekah Chesterfield [facility] on 03/13/2015. This over-read does not include interpretation of cardiac or coronary anatomy or pathology. The coronary calcium score/coronary CTA interpretation by the cardiologist is attached.  COMPARISON:  No priors.  FINDINGS: 4 mm subpleural nodule in the right middle lobe abutting the minor fissure (image 27 of series 204). No other larger more suspicious appearing pulmonary nodules or masses are noted in the visualized portions of the thorax. Within the visualized thorax there is no acute consolidative airspace disease, no pleural effusions, no pneumothorax and no lymphadenopathy. 1 cm cyst in segment 2 of the liver. 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 Visualized portions of the upper abdomen are otherwise unremarkable. There are no aggressive appearing lytic or blastic lesions noted in the visualized portions of the skeleton.  IMPRESSION: 1. 4 mm subpleural nodule in the right middle lobe abutting the minor fissure. Statistically, this  is likely to represent a subpleural lymph node, however, this is nonspecific. If the patient is at high risk for bronchogenic carcinoma, follow-up chest CT at 1 year is recommended. If the patient is at low risk, no follow-up is needed. This recommendation follows the consensus statement: Guidelines for Management of Small Pulmonary Nodules Detected on CT Scans: A Statement from the [place] as published in Radiology 2005; 237:395-400. 2. Additional incidental findings, as above.  Electronically Signed: By: Vinnie Langton M.D. On: 03/13/2015 13:57    Cardiac Studies:  ECG:  SR poor R wave progression no acute ST changes    Telemetry:  SR no arrhythmia  03/15/2015   Echo:   Medications:   . aspirin EC  81 mg Oral Daily  . atorvastatin  40 mg Oral q1800  .  clopidogrel  75 mg Oral Q breakfast  . metoprolol tartrate  25 mg Oral BID       Assessment/Plan:  Unstable Angina:  Post PCI RCA  DAT for a year outpatient f/u Tilley Chol:  On statin  Cardiac rehab has seen enzymes negative Ok for primary service to d/c   Jenkins Rouge 03/15/2015, 8:10 AM

## 2015-03-15 NOTE — Discharge Summary (Signed)
Physician Discharge Summary  Timothy Mendez GXQ:[id number] DOB: Sep 24, 1946 DOA: 03/13/2015  PCP: Dorian Heckle, MD  Admit date: 03/13/2015 Discharge date: 03/15/2015  Time spent: 65 minutes  Recommendations for Outpatient Follow-up:  1. Follow-up with Dr. Wynonia Lawman of cardiology in 2 weeks. 2. Follow-up with Dorian Heckle, MD in 1-2 weeks. Follow-up patient in need a basic metabolic profile to follow-up on electrolytes and renal function. Patient need a CBC to follow-up on his hemoglobin.  Discharge Diagnoses:  Principal Problem:   Unstable angina pectoris Active Problems:   Hypertension   CAD (coronary artery disease), native coronary artery   History of prostate cancer   Pain in the chest   Chest pain   Discharge Condition: Stable and improved  Diet recommendation: Heart healthy  Filed Weights   03/13/15 0522 03/14/15 0500 03/15/15 0100  Weight: 86.183 kg (190 lb) 88.8 kg (195 lb 12.3 oz) 88.1 kg (194 lb 3.6 oz)    History of present illness:  Timothy Mendez is a 69 y.o. male with a PMH of prostate cancer and proctectomy in 2002 who presented to the ED complaining of CP starting after he got back in bed after using the bathroom around 0230 on the morning of admission, rated at a 3/10 at onset. The pain did not radiate anywhere and he denied any other symptoms with the chest pain including SOB, palpitations, nausea, vomiting, jaw pain, arm pain or back pain. Further rest has not entirely relieved his pain. He had no hx of similar pain. Pt does have a hx of GERD and does take celebrex occasionally for knee pain. Did not seem to be excessive NSAID use and stated this pain was different than his normal GERD pain which he described as a burning sensation to midline of chest. His brother had a fatal MI at age 71 and his mother had CHF which she passed away from at age 5. Pt denied any changes in activity tolerance recently, gardens regularly and walks his dog. Of note, this  past week patient drove to [place] and back, approximately 8 hours each way. He denied any calf pain or hx of DVT/PE.  Pt's EKG and initial troponin were both normal. Due to his age, family hx and abnormal CP presentation, patient was admitted for telemetry monitoring and consulted pt's cardiologist Dr. Wynonia Lawman.    Hospital Course:   #1 chest pain/unstable angina Patient had presented with complaints of chest pain. Patient underwent cardiac CT which showed a calcium score of 1900 as well as severe 2 vessel disease. Cardiology was consulted on admission. Patient was admitted to a telemetry bed. Patient had complaints of chest pain early on in the hospitalization. Cardiac enzymes were negative 3. Fasting lipid panel with LDL of 88. Chest x-ray was negative. Patient was maintained on aspirin, Lipitor, Lopressor. Patient underwent cardiac catheterization on 03/14/2015 which showed.  Widely patent left main coronary. Mild to moderate diffuse disease in the LAD and its branches. Mild disease in the left circumflex artery and its branches. 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 Status post DES, post dilated to greater than 3.5 mm in diameter. Normal LV systolic function EF of 14%. Patient was placed on Plavix and aspirin and was recommended that he be on dual antiplatelet therapy for at least a year. Also recommended that patient undergo aggressive secondary prevention as well. Patient remained stable. Patient did not have any further chest pain. Patient was discharged in stable and improved condition will follow-up with his cardiologist  as outpatient.   #2 hyperlipidemia LDL of 88. Continued on Lipitor.  #3 hypertension Stable. Continued on Lopressor.  #4 history of prostate cancer status post prostatectomy in 2002 Outpatient follow-up.  #5 coronary artery disease Per cardiac CT. Continued aspirin, Lipitor, Lopressor. Patient underwent cardiac catheterization that showed severe lesion in  the mid RCA. Patient underwent drug-eluting stent. Patient was placed on dual antiplatelet therapy with Plavix and aspirin. Outpatient follow-up.   Procedures:  Chest x-ray 03/13/2015  CT coronary 03/13/2015  Cardiac catheterization: Widely patent left main coronary. Mild to moderate diffuse disease in the LAD and its branches. Mild disease in the left circumflex artery and its branches. 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 Status post DES, post dilated to greater than 3.5 mm in diameter. Normal LV systolic function EF of 17%. 03/14/2015. Dr. Irish Lack  Consultations:  Cardiology: Dr. Wynonia Lawman 03/13/2015  Discharge Exam: Filed Vitals:   03/15/15 0757  BP: 143/70  Pulse: 63  Temp: 98.3 F (36.8 C)  Resp: 20    General: NAD Cardiovascular: RRR Respiratory: CTAB  Discharge Instructions   Discharge Instructions    Amb Referral to Cardiac Rehabilitation    Complete by:  As directed      Diet - low sodium heart healthy    Complete by:  As directed      Discharge instructions    Complete by:  As directed   Follow up with Dorian Heckle, MD in 1-2 weeks. Follow up with Dr Wynonia Lawman in 2 weeks.     Increase activity slowly    Complete by:  As directed           Current Discharge Medication List    START taking these medications   Details  atorvastatin (LIPITOR) 40 MG tablet Take 1 tablet (40 mg total) by mouth daily at 6 PM. Qty: 30 tablet, Refills: 0    clopidogrel (PLAVIX) 75 MG tablet Take 1 tablet (75 mg total) by mouth daily with breakfast. Qty: 30 tablet, Refills: 0    metoprolol tartrate (LOPRESSOR) 25 MG tablet Take 1 tablet (25 mg total) by mouth 2 (two) times daily. Qty: 62 tablet, Refills: 0      CONTINUE these medications which have NOT CHANGED   Details  aspirin EC 81 MG tablet Take 81 mg by mouth daily.    CALCIUM PO Take 1 tablet by mouth daily.    celecoxib (CELEBREX) 200 MG capsule Take 200 mg by mouth 2 (two) times daily as needed for  moderate pain.     Cholecalciferol (VITAMIN D PO) Take 1 tablet by mouth daily.    Glucosamine-Chondroitin (GLUCOSAMINE CHONDR COMPLEX PO) Take 1 tablet by mouth daily.    Multiple Vitamins-Minerals (MULTIVITAMIN PO) Take 1 tablet by mouth daily.    Omega-3 Fatty Acids (FISH OIL PO) Take 1 tablet by mouth daily.       No Known Allergies    The results of significant diagnostics from this hospitalization (including imaging, microbiology, ancillary and laboratory) are listed below for reference.    Significant Diagnostic Studies: Dg Chest 2 View  03/13/2015   CLINICAL DATA:  Patient woke up at 2 a.m. with Mid chest pain.  EXAM: CHEST  2 VIEW  COMPARISON:  None.  FINDINGS: Normal heart size and pulmonary vascularity. No focal airspace disease or consolidation in the lungs. No blunting of costophrenic angles. No pneumothorax. Mediastinal contours appear intact. Degenerative changes in the spine.  IMPRESSION: No active cardiopulmonary disease.   Electronically Signed  By: Lucienne Capers M.D.   On: 03/13/2015 05:58   Ct Coronary Morp W/cta Cor W/score W/ca W/cm &/or Wo/cm  03/13/2015   ADDENDUM REPORT: 03/13/2015 16:39  CLINICAL DATA:  69 year old male with chest pain.  EXAM: Cardiac/Coronary  CT  TECHNIQUE: The patient was scanned on a Philips 256 scanner.  FINDINGS: A 120 kV prospective scan was triggered in the descending thoracic aorta at 111 HU's. Axial non-contrast 3 mm slices were carried out through the heart. The data set was analyzed on a dedicated work station and scored using the [place]. Gantry rotation speed was 270 msecs and collimation was .9 mm. No beta blockade and 0.8 mg of sl NTG was given. The 3D data set was reconstructed in 5% intervals of the 67-82 % of the R-R cycle. Diastolic phases were analyzed on a dedicated work station using MPR, MIP and VRT modes. The patient received 80 cc of contrast.  Aorta:  Normal caliber, no dissection.  Aortic Valve:  Trileaflet, no  calcifications.  Coronary Arteries:  Normal origin.  Right dominance.  Left main is a large vessel with minimal non-calcified plaque.  LAD is a large and long vessel that wraps around the apex and gives rise to very small diagonal branches. Proximal and mid LAD has severe long circumferential calcifications mixed with non-calcified plaque with 50-70% stenosis and possible > 70% stenosis in mid LAD. Distal LAD has diffuse moderate calcifications.  LCX is a nondominant vessel that gives rise to 2 obtuse marginal branches. LCX has multiple mild calcified plaques in its proximal segment associated with 0-25% stenosis.  OM1 is a small vessel that has diffuse calcifications and focal > 70% stenosis.  OM2 is a large vessel that further subbranches and has multiple mild calcified plaques with associated stenosis 0-25%.  RCA is a large caliber dominant vessel that has diffuse mixed plaque with significant diffuse calcifications.  There is mild noncalcified plaque in the ostium associated with 25-50% stenosis, followed by calcified 0-25% stenosis.  Mid RCA has complex mixed plaque with severe > 70 stenosis.  Distal RCA has only mild diffuse plaque.  Proximal PDA has moderate calcified 51-70% stenosis.  Other findings:  Moderate concentric LVH.  Large left atrial appendage without filling defect.  No ASD seen.  Normal drainage of the pulmonary veins.  IMPRESSION: 1. Coronary calcium score of 1906 (LAD 1180, RCA 438, LCX 288). This was 37 percentile for age and sex matched control.  2.  Normal coronary origin, right dominance.  3. There is severe diffuse calcification of all three coronary arteries with two vessel CAD in mid RCA and mid LAD.  Cardiac catheterization is recommended.  4.  Moderate concentric left ventricular hypertrophy.  Ena Dawley   Electronically Signed   By: Ena Dawley   On: 03/13/2015 16:39   03/13/2015   EXAM: OVER-READ INTERPRETATION  CT CHEST  The following report is an over-read performed by  radiologist Dr. Rebekah Chesterfield [facility] on 03/13/2015. This over-read does not include interpretation of cardiac or coronary anatomy or pathology. The coronary calcium score/coronary CTA interpretation by the cardiologist is attached.  COMPARISON:  No priors.  FINDINGS: 4 mm subpleural nodule in the right middle lobe abutting the minor fissure (image 27 of series 204). No other larger more suspicious appearing pulmonary nodules or masses are noted in the visualized portions of the thorax. Within the visualized thorax there is no acute consolidative airspace disease, no pleural effusions, no pneumothorax and no  lymphadenopathy. 1 cm cyst in segment 2 of the liver. 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 Visualized portions of the upper abdomen are otherwise unremarkable. There are no aggressive appearing lytic or blastic lesions noted in the visualized portions of the skeleton.  IMPRESSION: 1. 4 mm subpleural nodule in the right middle lobe abutting the minor fissure. Statistically, this is likely to represent a subpleural lymph node, however, this is nonspecific. If the patient is at high risk for bronchogenic carcinoma, follow-up chest CT at 1 year is recommended. If the patient is at low risk, no follow-up is needed. This recommendation follows the consensus statement: Guidelines for Management of Small Pulmonary Nodules Detected on CT Scans: A Statement from the [employer] as published in Radiology 2005; 237:395-400. 2. Additional incidental findings, as above.  Electronically Signed: By: Vinnie Langton M.D. On: 03/13/2015 13:57    Microbiology: No results found for this or any previous visit (from the past 240 hour(s)).   Labs: Basic Metabolic Panel:  Recent Labs Lab 03/13/15 0524 03/14/15 0403 03/15/15 0403  NA 139 141 139  K 3.9 4.3 3.7  CL 101 104 103  CO2 31 27 23   GLUCOSE 106* 106* 94  BUN 18 15 11   CREATININE 1.02 1.09 1.04  CALCIUM 9.3 9.2  8.9   Liver Function Tests: No results for input(s): AST, ALT, ALKPHOS, BILITOT, PROT, ALBUMIN in the last 168 hours. No results for input(s): LIPASE, AMYLASE in the last 168 hours. No results for input(s): AMMONIA in the last 168 hours. CBC:  Recent Labs Lab 03/13/15 0524 03/14/15 0403 03/14/15 2304 03/15/15 0403  WBC 6.5 5.3  --  6.7  NEUTROABS 4.2  --   --   --   HGB 15.6 13.9  --  13.4  HCT 44.8 40.6  --  39.5  MCV 93.5 92.9  --  93.4  PLT 191 180 185 176   Cardiac Enzymes:  Recent Labs Lab 03/13/15 0916 03/13/15 1339 03/13/15 1519  TROPONINI <0.03 <0.03 <0.03   BNP: BNP (last 3 results) No results for input(s): BNP in the last 8760 hours.  ProBNP (last 3 results) No results for input(s): PROBNP in the last 8760 hours.  CBG: No results for input(s): GLUCAP in the last 168 hours.     SignedIrine Seal MD [facility] 03/15/2015, 9:03 AM

## 2015-03-20 ENCOUNTER — Telehealth (HOSPITAL_COMMUNITY): Payer: Self-pay | Admitting: *Deleted

## 2015-03-20 NOTE — Telephone Encounter (Signed)
Received signed order from Dr. Wynonia Lawman for cardiac rehab.  Message left for pt to please contact cardiac rehab to sign up for exercise class.  Contact information provided. Cherre Huger, BSN

## 2015-03-25 DIAGNOSIS — Z8546 Personal history of malignant neoplasm of prostate: Secondary | ICD-10-CM | POA: Diagnosis not present

## 2015-03-25 DIAGNOSIS — I2510 Atherosclerotic heart disease of native coronary artery without angina pectoris: Secondary | ICD-10-CM | POA: Diagnosis not present

## 2015-03-25 DIAGNOSIS — Z955 Presence of coronary angioplasty implant and graft: Secondary | ICD-10-CM | POA: Diagnosis not present

## 2015-03-25 DIAGNOSIS — R0789 Other chest pain: Secondary | ICD-10-CM | POA: Diagnosis not present

## 2015-03-25 DIAGNOSIS — E785 Hyperlipidemia, unspecified: Secondary | ICD-10-CM | POA: Diagnosis not present

## 2015-03-25 DIAGNOSIS — R002 Palpitations: Secondary | ICD-10-CM | POA: Diagnosis not present

## 2015-03-25 DIAGNOSIS — I10 Essential (primary) hypertension: Secondary | ICD-10-CM | POA: Diagnosis not present

## 2015-03-25 DIAGNOSIS — K219 Gastro-esophageal reflux disease without esophagitis: Secondary | ICD-10-CM | POA: Diagnosis not present

## 2015-04-03 DIAGNOSIS — K219 Gastro-esophageal reflux disease without esophagitis: Secondary | ICD-10-CM | POA: Diagnosis not present

## 2015-04-03 DIAGNOSIS — I25110 Atherosclerotic heart disease of native coronary artery with unstable angina pectoris: Secondary | ICD-10-CM | POA: Diagnosis not present

## 2015-04-03 DIAGNOSIS — R0789 Other chest pain: Secondary | ICD-10-CM | POA: Diagnosis not present

## 2015-04-08 DIAGNOSIS — I2510 Atherosclerotic heart disease of native coronary artery without angina pectoris: Secondary | ICD-10-CM | POA: Diagnosis not present

## 2015-04-10 ENCOUNTER — Encounter (HOSPITAL_COMMUNITY)
Admission: RE | Admit: 2015-04-10 | Discharge: 2015-04-10 | Disposition: A | Discharge status: Home / Self Care | Payer: Medicare Other | Source: Ambulatory Visit | Attending: Cardiology | Admitting: Cardiology

## 2015-04-10 DIAGNOSIS — Z955 Presence of coronary angioplasty implant and graft: Secondary | ICD-10-CM | POA: Insufficient documentation

## 2015-04-10 DIAGNOSIS — I25119 Atherosclerotic heart disease of native coronary artery with unspecified angina pectoris: Secondary | ICD-10-CM | POA: Insufficient documentation

## 2015-04-10 DIAGNOSIS — Z48812 Encounter for surgical aftercare following surgery on the circulatory system: Secondary | ICD-10-CM | POA: Insufficient documentation

## 2015-04-10 NOTE — Progress Notes (Signed)
Cardiac Rehab Medication Review by a Pharmacist  Does the patient feel that his/her medications are working for him/her?  yes  Has the patient been experiencing any side effects to the medications prescribed?  no  Does the patient measure his/her own blood pressure or blood glucose at home?  no   Does the patient have any problems obtaining medications due to transportation or finances?   no  Understanding of regimen: good Understanding of indications: good Potential of compliance: good  Pharmacist comments: Patient has no barriers to obtaining his medications due to transportation or insurance issues. He does not currently measure his blood pressure at home because he has been to the doctor's office so many times lately.  He is not experiencing any side effects and has no questions for me today.  Cassie L. Nicole Kindred, PharmD Clinical Pharmacy Resident Pager: [phone] 04/10/2015 8:21 AM

## 2015-04-14 ENCOUNTER — Encounter (HOSPITAL_COMMUNITY)
Admission: RE | Admit: 2015-04-14 | Discharge: 2015-04-14 | Disposition: A | Discharge status: Home / Self Care | Payer: Medicare Other | Source: Ambulatory Visit | Attending: Cardiology | Admitting: Cardiology

## 2015-04-14 DIAGNOSIS — I25119 Atherosclerotic heart disease of native coronary artery with unspecified angina pectoris: Secondary | ICD-10-CM | POA: Diagnosis not present

## 2015-04-14 DIAGNOSIS — Z955 Presence of coronary angioplasty implant and graft: Secondary | ICD-10-CM | POA: Diagnosis not present

## 2015-04-14 DIAGNOSIS — Z48812 Encounter for surgical aftercare following surgery on the circulatory system: Secondary | ICD-10-CM | POA: Diagnosis not present

## 2015-04-14 NOTE — Progress Notes (Signed)
Pt started cardiac rehab today.  Pt tolerated light exercise without difficulty. VSS, telemetry-Sinus Brady 49 resting heart rate, .  Medication list reconciled.  Pt verbalized compliance with medications and denies barriers to compliance. PSYCHOSOCIAL ASSESSMENT:  PHQ-O. Pt exhibits positive coping skills, hopeful outlook with supportive family. No psychosocial needs identified at this time, no psychosocial interventions necessary.     Pt cardiac rehab short term goal is  to increase his muscle tone.  Pt encouraged to participate in exercising on your own class  to increase ability to achieve these goals.   Pt long term cardiac rehab goal is to loose weight.  Pt oriented to exercise equipment and routine.  Understanding verbalized. Patient complained of feeling slightly light headed after exercise was completed right before cool down. Blood pressure 122/64 heart rate at the time sinus brady 55. Patient drank water. Symptoms resolved quickly. Maximum heart rate today noted at 67. Sitting blood pressure 118/68, Standing blood pressure 118/72 patient denied any symptoms when changing positions. Post exercise blood pressure 118/72. Dr Thurman Coyer office called and notified left message with Dr Thurman Coyer nurse Juliann Pulse. Bill left cardiac rehab today without symptoms. Will continue to monitor the patient throughout  the program.

## 2015-04-16 ENCOUNTER — Encounter (HOSPITAL_COMMUNITY)
Admission: RE | Admit: 2015-04-16 | Discharge: 2015-04-16 | Disposition: A | Discharge status: Home / Self Care | Payer: Medicare Other | Source: Ambulatory Visit | Attending: Cardiology | Admitting: Cardiology

## 2015-04-16 DIAGNOSIS — I25119 Atherosclerotic heart disease of native coronary artery with unspecified angina pectoris: Secondary | ICD-10-CM | POA: Diagnosis not present

## 2015-04-16 DIAGNOSIS — Z955 Presence of coronary angioplasty implant and graft: Secondary | ICD-10-CM | POA: Diagnosis not present

## 2015-04-16 DIAGNOSIS — Z48812 Encounter for surgical aftercare following surgery on the circulatory system: Secondary | ICD-10-CM | POA: Diagnosis not present

## 2015-04-18 ENCOUNTER — Encounter (HOSPITAL_COMMUNITY)
Admission: RE | Admit: 2015-04-18 | Discharge: 2015-04-18 | Disposition: A | Discharge status: Home / Self Care | Payer: Medicare Other | Source: Ambulatory Visit | Attending: Cardiology | Admitting: Cardiology

## 2015-04-18 DIAGNOSIS — Z48812 Encounter for surgical aftercare following surgery on the circulatory system: Secondary | ICD-10-CM | POA: Diagnosis not present

## 2015-04-18 DIAGNOSIS — I25119 Atherosclerotic heart disease of native coronary artery with unspecified angina pectoris: Secondary | ICD-10-CM | POA: Diagnosis not present

## 2015-04-18 DIAGNOSIS — Z955 Presence of coronary angioplasty implant and graft: Secondary | ICD-10-CM | POA: Diagnosis not present

## 2015-04-18 NOTE — Progress Notes (Signed)
Academic intern reviewed home exercise with pt today.  Pt plans to walk at home and go to gym for exercise.  Reviewed THR, pulse, RPE, sign and symptoms, NTG use, and when to call 911 or MD.  Pt voiced understanding. Shirlyn Goltz, Academic Intern Alberteen Sam, MA, ACSM RCEP

## 2015-04-21 ENCOUNTER — Encounter (HOSPITAL_COMMUNITY)
Admission: RE | Admit: 2015-04-21 | Discharge: 2015-04-21 | Disposition: A | Discharge status: Home / Self Care | Payer: Medicare Other | Source: Ambulatory Visit | Attending: Cardiology | Admitting: Cardiology

## 2015-04-21 DIAGNOSIS — Z955 Presence of coronary angioplasty implant and graft: Secondary | ICD-10-CM | POA: Diagnosis not present

## 2015-04-21 DIAGNOSIS — Z48812 Encounter for surgical aftercare following surgery on the circulatory system: Secondary | ICD-10-CM | POA: Diagnosis not present

## 2015-04-21 DIAGNOSIS — I25119 Atherosclerotic heart disease of native coronary artery with unspecified angina pectoris: Secondary | ICD-10-CM | POA: Diagnosis not present

## 2015-04-23 ENCOUNTER — Encounter (HOSPITAL_COMMUNITY)
Admission: RE | Admit: 2015-04-23 | Discharge: 2015-04-23 | Disposition: A | Discharge status: Home / Self Care | Payer: Medicare Other | Source: Ambulatory Visit | Attending: Cardiology | Admitting: Cardiology

## 2015-04-23 DIAGNOSIS — Z955 Presence of coronary angioplasty implant and graft: Secondary | ICD-10-CM | POA: Diagnosis not present

## 2015-04-23 DIAGNOSIS — Z48812 Encounter for surgical aftercare following surgery on the circulatory system: Secondary | ICD-10-CM | POA: Diagnosis not present

## 2015-04-23 DIAGNOSIS — I25119 Atherosclerotic heart disease of native coronary artery with unspecified angina pectoris: Secondary | ICD-10-CM | POA: Diagnosis not present

## 2015-04-25 ENCOUNTER — Encounter (HOSPITAL_COMMUNITY)
Admission: RE | Admit: 2015-04-25 | Discharge: 2015-04-25 | Disposition: A | Discharge status: Home / Self Care | Payer: Medicare Other | Source: Ambulatory Visit | Attending: Cardiology | Admitting: Cardiology

## 2015-04-25 DIAGNOSIS — I25119 Atherosclerotic heart disease of native coronary artery with unspecified angina pectoris: Secondary | ICD-10-CM | POA: Diagnosis not present

## 2015-04-25 DIAGNOSIS — Z48812 Encounter for surgical aftercare following surgery on the circulatory system: Secondary | ICD-10-CM | POA: Diagnosis not present

## 2015-04-25 DIAGNOSIS — Z955 Presence of coronary angioplasty implant and graft: Secondary | ICD-10-CM | POA: Diagnosis not present

## 2015-04-28 ENCOUNTER — Encounter (HOSPITAL_COMMUNITY)
Admission: RE | Admit: 2015-04-28 | Discharge: 2015-04-28 | Disposition: A | Discharge status: Home / Self Care | Payer: Medicare Other | Source: Ambulatory Visit | Attending: Cardiology | Admitting: Cardiology

## 2015-04-28 DIAGNOSIS — I25119 Atherosclerotic heart disease of native coronary artery with unspecified angina pectoris: Secondary | ICD-10-CM | POA: Diagnosis not present

## 2015-04-28 DIAGNOSIS — Z955 Presence of coronary angioplasty implant and graft: Secondary | ICD-10-CM | POA: Diagnosis not present

## 2015-04-28 DIAGNOSIS — Z48812 Encounter for surgical aftercare following surgery on the circulatory system: Secondary | ICD-10-CM | POA: Diagnosis not present

## 2015-04-30 ENCOUNTER — Encounter (HOSPITAL_COMMUNITY)
Admission: RE | Admit: 2015-04-30 | Discharge: 2015-04-30 | Disposition: A | Discharge status: Home / Self Care | Payer: Medicare Other | Source: Ambulatory Visit | Attending: Cardiology | Admitting: Cardiology

## 2015-04-30 DIAGNOSIS — Z48812 Encounter for surgical aftercare following surgery on the circulatory system: Secondary | ICD-10-CM | POA: Diagnosis not present

## 2015-04-30 DIAGNOSIS — Z955 Presence of coronary angioplasty implant and graft: Secondary | ICD-10-CM | POA: Diagnosis not present

## 2015-04-30 DIAGNOSIS — I25119 Atherosclerotic heart disease of native coronary artery with unspecified angina pectoris: Secondary | ICD-10-CM | POA: Diagnosis not present

## 2015-05-02 ENCOUNTER — Encounter (HOSPITAL_COMMUNITY)
Admission: RE | Admit: 2015-05-02 | Discharge: 2015-05-02 | Disposition: A | Discharge status: Home / Self Care | Payer: Medicare Other | Source: Ambulatory Visit | Attending: Cardiology | Admitting: Cardiology

## 2015-05-02 DIAGNOSIS — I25119 Atherosclerotic heart disease of native coronary artery with unspecified angina pectoris: Secondary | ICD-10-CM | POA: Diagnosis not present

## 2015-05-02 DIAGNOSIS — Z955 Presence of coronary angioplasty implant and graft: Secondary | ICD-10-CM | POA: Diagnosis not present

## 2015-05-02 DIAGNOSIS — Z48812 Encounter for surgical aftercare following surgery on the circulatory system: Secondary | ICD-10-CM | POA: Diagnosis not present

## 2015-05-07 ENCOUNTER — Encounter (HOSPITAL_COMMUNITY)
Admission: RE | Admit: 2015-05-07 | Discharge: 2015-05-07 | Disposition: A | Discharge status: Home / Self Care | Payer: Medicare Other | Source: Ambulatory Visit | Attending: Cardiology | Admitting: Cardiology

## 2015-05-07 DIAGNOSIS — I25119 Atherosclerotic heart disease of native coronary artery with unspecified angina pectoris: Secondary | ICD-10-CM | POA: Diagnosis not present

## 2015-05-07 DIAGNOSIS — Z48812 Encounter for surgical aftercare following surgery on the circulatory system: Secondary | ICD-10-CM | POA: Insufficient documentation

## 2015-05-07 DIAGNOSIS — Z955 Presence of coronary angioplasty implant and graft: Secondary | ICD-10-CM | POA: Insufficient documentation

## 2015-05-09 ENCOUNTER — Encounter (HOSPITAL_COMMUNITY): Payer: Medicare Other

## 2015-05-12 ENCOUNTER — Encounter (HOSPITAL_COMMUNITY)
Admission: RE | Admit: 2015-05-12 | Discharge: 2015-05-12 | Disposition: A | Discharge status: Home / Self Care | Payer: Medicare Other | Source: Ambulatory Visit | Attending: Cardiology | Admitting: Cardiology

## 2015-05-12 DIAGNOSIS — Z955 Presence of coronary angioplasty implant and graft: Secondary | ICD-10-CM | POA: Diagnosis not present

## 2015-05-12 DIAGNOSIS — Z48812 Encounter for surgical aftercare following surgery on the circulatory system: Secondary | ICD-10-CM | POA: Diagnosis not present

## 2015-05-12 DIAGNOSIS — I25119 Atherosclerotic heart disease of native coronary artery with unspecified angina pectoris: Secondary | ICD-10-CM | POA: Diagnosis not present

## 2015-05-14 ENCOUNTER — Encounter (HOSPITAL_COMMUNITY)
Admission: RE | Admit: 2015-05-14 | Discharge: 2015-05-14 | Disposition: A | Discharge status: Home / Self Care | Payer: Medicare Other | Source: Ambulatory Visit | Attending: Cardiology | Admitting: Cardiology

## 2015-05-14 DIAGNOSIS — Z48812 Encounter for surgical aftercare following surgery on the circulatory system: Secondary | ICD-10-CM | POA: Diagnosis not present

## 2015-05-14 DIAGNOSIS — I25119 Atherosclerotic heart disease of native coronary artery with unspecified angina pectoris: Secondary | ICD-10-CM | POA: Diagnosis not present

## 2015-05-14 DIAGNOSIS — Z955 Presence of coronary angioplasty implant and graft: Secondary | ICD-10-CM | POA: Diagnosis not present

## 2015-05-14 NOTE — Progress Notes (Signed)
Timothy Mendez 69 y.o. male Nutrition Note Spoke with pt.  Nutrition Survey reviewed with pt. Pt is following Step 2 of the Therapeutic Lifestyle Changes diet. Pt wants to lose wt. Pt has been trying to lose wt by "following the [employer] loss tips reviewed. Pt expressed understanding of the information reviewed. Pt aware of nutrition education classes offered and plans on attending nutrition classes. No results found for: HGBA1C Wt Readings from Last 3 Encounters:  04/10/15 191 lb 12.8 oz (87 kg)  03/15/15 194 lb 3.6 oz (88.1 kg)   Nutrition Diagnosis ? Food-and nutrition-related knowledge deficit related to lack of exposure to information as related to diagnosis of: ? CVD  ? Overweight related to excessive energy intake as evidenced by a BMI of 27.5  Nutrition Intervention ? Benefits of adopting Therapeutic Lifestyle Changes discussed when Medficts reviewed. ? Pt to attend the Portion Distortion class ? Pt to attend the  ? Nutrition I class                     ? Nutrition II class - met; 04/15/15 ? Continue client-centered nutrition education by RD, as part of interdisciplinary care.  Goal(s) ? Pt to identify food quantities necessary to achieve: ? wt loss to a goal wt of 167-185 lb (76.1-84.3 kg) at graduation from cardiac rehab.   Monitor and Evaluate progress toward nutrition goal with team.  Derek Mound, M.Ed, RD, LDN, CDE 05/14/2015 11:03 AM

## 2015-05-16 ENCOUNTER — Encounter (HOSPITAL_COMMUNITY)
Admission: RE | Admit: 2015-05-16 | Discharge: 2015-05-16 | Disposition: A | Discharge status: Home / Self Care | Payer: Medicare Other | Source: Ambulatory Visit | Attending: Cardiology | Admitting: Cardiology

## 2015-05-16 DIAGNOSIS — I25119 Atherosclerotic heart disease of native coronary artery with unspecified angina pectoris: Secondary | ICD-10-CM | POA: Diagnosis not present

## 2015-05-16 DIAGNOSIS — Z955 Presence of coronary angioplasty implant and graft: Secondary | ICD-10-CM | POA: Diagnosis not present

## 2015-05-16 DIAGNOSIS — Z48812 Encounter for surgical aftercare following surgery on the circulatory system: Secondary | ICD-10-CM | POA: Diagnosis not present

## 2015-05-19 ENCOUNTER — Encounter (HOSPITAL_COMMUNITY)
Admission: RE | Admit: 2015-05-19 | Discharge: 2015-05-19 | Disposition: A | Discharge status: Home / Self Care | Payer: Medicare Other | Source: Ambulatory Visit | Attending: Cardiology | Admitting: Cardiology

## 2015-05-19 DIAGNOSIS — Z955 Presence of coronary angioplasty implant and graft: Secondary | ICD-10-CM | POA: Diagnosis not present

## 2015-05-19 DIAGNOSIS — Z48812 Encounter for surgical aftercare following surgery on the circulatory system: Secondary | ICD-10-CM | POA: Diagnosis not present

## 2015-05-19 DIAGNOSIS — I25119 Atherosclerotic heart disease of native coronary artery with unspecified angina pectoris: Secondary | ICD-10-CM | POA: Diagnosis not present

## 2015-05-21 ENCOUNTER — Encounter (HOSPITAL_COMMUNITY)
Admission: RE | Admit: 2015-05-21 | Discharge: 2015-05-21 | Disposition: A | Discharge status: Home / Self Care | Payer: Medicare Other | Source: Ambulatory Visit | Attending: Cardiology | Admitting: Cardiology

## 2015-05-21 DIAGNOSIS — Z48812 Encounter for surgical aftercare following surgery on the circulatory system: Secondary | ICD-10-CM | POA: Diagnosis not present

## 2015-05-21 DIAGNOSIS — I25119 Atherosclerotic heart disease of native coronary artery with unspecified angina pectoris: Secondary | ICD-10-CM | POA: Diagnosis not present

## 2015-05-21 DIAGNOSIS — Z955 Presence of coronary angioplasty implant and graft: Secondary | ICD-10-CM | POA: Diagnosis not present

## 2015-05-23 ENCOUNTER — Encounter (HOSPITAL_COMMUNITY)
Admission: RE | Admit: 2015-05-23 | Discharge: 2015-05-23 | Disposition: A | Discharge status: Home / Self Care | Payer: Medicare Other | Source: Ambulatory Visit | Attending: Cardiology | Admitting: Cardiology

## 2015-05-23 DIAGNOSIS — Z955 Presence of coronary angioplasty implant and graft: Secondary | ICD-10-CM | POA: Diagnosis not present

## 2015-05-23 DIAGNOSIS — I25119 Atherosclerotic heart disease of native coronary artery with unspecified angina pectoris: Secondary | ICD-10-CM | POA: Diagnosis not present

## 2015-05-23 DIAGNOSIS — Z48812 Encounter for surgical aftercare following surgery on the circulatory system: Secondary | ICD-10-CM | POA: Diagnosis not present

## 2015-05-26 ENCOUNTER — Encounter (HOSPITAL_COMMUNITY)
Admission: RE | Admit: 2015-05-26 | Discharge: 2015-05-26 | Disposition: A | Discharge status: Home / Self Care | Payer: Medicare Other | Source: Ambulatory Visit | Attending: Cardiology | Admitting: Cardiology

## 2015-05-26 DIAGNOSIS — Z955 Presence of coronary angioplasty implant and graft: Secondary | ICD-10-CM | POA: Diagnosis not present

## 2015-05-26 DIAGNOSIS — Z48812 Encounter for surgical aftercare following surgery on the circulatory system: Secondary | ICD-10-CM | POA: Diagnosis not present

## 2015-05-26 DIAGNOSIS — I25119 Atherosclerotic heart disease of native coronary artery with unspecified angina pectoris: Secondary | ICD-10-CM | POA: Diagnosis not present

## 2015-05-28 ENCOUNTER — Encounter (HOSPITAL_COMMUNITY)
Admission: RE | Admit: 2015-05-28 | Discharge: 2015-05-28 | Disposition: A | Discharge status: Home / Self Care | Payer: Medicare Other | Source: Ambulatory Visit | Attending: Cardiology | Admitting: Cardiology

## 2015-05-28 DIAGNOSIS — I25119 Atherosclerotic heart disease of native coronary artery with unspecified angina pectoris: Secondary | ICD-10-CM | POA: Diagnosis not present

## 2015-05-28 DIAGNOSIS — Z955 Presence of coronary angioplasty implant and graft: Secondary | ICD-10-CM | POA: Diagnosis not present

## 2015-05-28 DIAGNOSIS — Z48812 Encounter for surgical aftercare following surgery on the circulatory system: Secondary | ICD-10-CM | POA: Diagnosis not present

## 2015-05-30 ENCOUNTER — Encounter (HOSPITAL_COMMUNITY)
Admission: RE | Admit: 2015-05-30 | Discharge: 2015-05-30 | Disposition: A | Discharge status: Home / Self Care | Payer: Medicare Other | Source: Ambulatory Visit | Attending: Cardiology | Admitting: Cardiology

## 2015-05-30 DIAGNOSIS — N529 Male erectile dysfunction, unspecified: Secondary | ICD-10-CM | POA: Diagnosis not present

## 2015-05-30 DIAGNOSIS — I25119 Atherosclerotic heart disease of native coronary artery with unspecified angina pectoris: Secondary | ICD-10-CM | POA: Diagnosis not present

## 2015-05-30 DIAGNOSIS — Z955 Presence of coronary angioplasty implant and graft: Secondary | ICD-10-CM | POA: Diagnosis not present

## 2015-05-30 DIAGNOSIS — C61 Malignant neoplasm of prostate: Secondary | ICD-10-CM | POA: Diagnosis not present

## 2015-05-30 DIAGNOSIS — Z48812 Encounter for surgical aftercare following surgery on the circulatory system: Secondary | ICD-10-CM | POA: Diagnosis not present

## 2015-06-02 ENCOUNTER — Encounter (HOSPITAL_COMMUNITY)
Admission: RE | Admit: 2015-06-02 | Discharge: 2015-06-02 | Disposition: A | Discharge status: Home / Self Care | Payer: Medicare Other | Source: Ambulatory Visit | Attending: Cardiology | Admitting: Cardiology

## 2015-06-02 DIAGNOSIS — I25119 Atherosclerotic heart disease of native coronary artery with unspecified angina pectoris: Secondary | ICD-10-CM | POA: Diagnosis not present

## 2015-06-02 DIAGNOSIS — Z48812 Encounter for surgical aftercare following surgery on the circulatory system: Secondary | ICD-10-CM | POA: Diagnosis not present

## 2015-06-02 DIAGNOSIS — Z955 Presence of coronary angioplasty implant and graft: Secondary | ICD-10-CM | POA: Diagnosis not present

## 2015-06-03 DIAGNOSIS — H5203 Hypermetropia, bilateral: Secondary | ICD-10-CM | POA: Diagnosis not present

## 2015-06-03 DIAGNOSIS — H524 Presbyopia: Secondary | ICD-10-CM | POA: Diagnosis not present

## 2015-06-03 DIAGNOSIS — H43393 Other vitreous opacities, bilateral: Secondary | ICD-10-CM | POA: Diagnosis not present

## 2015-06-03 DIAGNOSIS — H52223 Regular astigmatism, bilateral: Secondary | ICD-10-CM | POA: Diagnosis not present

## 2015-06-04 ENCOUNTER — Encounter (HOSPITAL_COMMUNITY)
Admission: RE | Admit: 2015-06-04 | Discharge: 2015-06-04 | Disposition: A | Discharge status: Home / Self Care | Payer: Medicare Other | Source: Ambulatory Visit | Attending: Cardiology | Admitting: Cardiology

## 2015-06-04 DIAGNOSIS — Z48812 Encounter for surgical aftercare following surgery on the circulatory system: Secondary | ICD-10-CM | POA: Diagnosis not present

## 2015-06-04 DIAGNOSIS — I25119 Atherosclerotic heart disease of native coronary artery with unspecified angina pectoris: Secondary | ICD-10-CM | POA: Diagnosis not present

## 2015-06-04 DIAGNOSIS — Z955 Presence of coronary angioplasty implant and graft: Secondary | ICD-10-CM | POA: Diagnosis not present

## 2015-06-06 ENCOUNTER — Encounter (HOSPITAL_COMMUNITY): Payer: Medicare Other

## 2015-06-11 ENCOUNTER — Encounter (HOSPITAL_COMMUNITY)
Admission: RE | Admit: 2015-06-11 | Discharge: 2015-06-11 | Disposition: A | Discharge status: Home / Self Care | Payer: Medicare Other | Source: Ambulatory Visit | Attending: Cardiology | Admitting: Cardiology

## 2015-06-11 DIAGNOSIS — Z48812 Encounter for surgical aftercare following surgery on the circulatory system: Secondary | ICD-10-CM | POA: Insufficient documentation

## 2015-06-11 DIAGNOSIS — Z955 Presence of coronary angioplasty implant and graft: Secondary | ICD-10-CM | POA: Diagnosis not present

## 2015-06-11 DIAGNOSIS — I25119 Atherosclerotic heart disease of native coronary artery with unspecified angina pectoris: Secondary | ICD-10-CM | POA: Insufficient documentation

## 2015-06-13 ENCOUNTER — Encounter (HOSPITAL_COMMUNITY)
Admission: RE | Admit: 2015-06-13 | Discharge: 2015-06-13 | Disposition: A | Discharge status: Home / Self Care | Payer: Medicare Other | Source: Ambulatory Visit | Attending: Cardiology | Admitting: Cardiology

## 2015-06-13 ENCOUNTER — Telehealth: Payer: Self-pay | Admitting: Internal Medicine

## 2015-06-13 DIAGNOSIS — Z48812 Encounter for surgical aftercare following surgery on the circulatory system: Secondary | ICD-10-CM | POA: Diagnosis not present

## 2015-06-13 DIAGNOSIS — Z955 Presence of coronary angioplasty implant and graft: Secondary | ICD-10-CM | POA: Diagnosis not present

## 2015-06-13 DIAGNOSIS — I25119 Atherosclerotic heart disease of native coronary artery with unspecified angina pectoris: Secondary | ICD-10-CM | POA: Diagnosis not present

## 2015-06-13 NOTE — Telephone Encounter (Signed)
I am currently not taking new pt's -  Poss see who is in the office ?

## 2015-06-13 NOTE — Telephone Encounter (Signed)
Timothy Mendez ([birth year]) your patient   called to see if you would take her husband as a new patient Medicare and tricare

## 2015-06-16 ENCOUNTER — Encounter (HOSPITAL_COMMUNITY): Payer: Medicare Other

## 2015-06-18 ENCOUNTER — Encounter (HOSPITAL_COMMUNITY): Payer: Medicare Other

## 2015-06-18 NOTE — Telephone Encounter (Signed)
Left message asking [name] to call the office   See below message

## 2015-06-20 ENCOUNTER — Encounter (HOSPITAL_COMMUNITY): Payer: Medicare Other

## 2015-06-23 ENCOUNTER — Encounter (HOSPITAL_COMMUNITY)
Admission: RE | Admit: 2015-06-23 | Discharge: 2015-06-23 | Disposition: A | Discharge status: Home / Self Care | Payer: Medicare Other | Source: Ambulatory Visit | Attending: Cardiology | Admitting: Cardiology

## 2015-06-23 DIAGNOSIS — I25119 Atherosclerotic heart disease of native coronary artery with unspecified angina pectoris: Secondary | ICD-10-CM | POA: Diagnosis not present

## 2015-06-23 DIAGNOSIS — Z48812 Encounter for surgical aftercare following surgery on the circulatory system: Secondary | ICD-10-CM | POA: Diagnosis not present

## 2015-06-23 DIAGNOSIS — Z955 Presence of coronary angioplasty implant and graft: Secondary | ICD-10-CM | POA: Diagnosis not present

## 2015-06-25 ENCOUNTER — Telehealth (HOSPITAL_COMMUNITY): Payer: Self-pay | Admitting: *Deleted

## 2015-06-25 ENCOUNTER — Encounter (HOSPITAL_COMMUNITY): Admission: RE | Admit: 2015-06-25 | Payer: Medicare Other | Source: Ambulatory Visit

## 2015-06-27 ENCOUNTER — Encounter (HOSPITAL_COMMUNITY)
Admission: RE | Admit: 2015-06-27 | Discharge: 2015-06-27 | Disposition: A | Discharge status: Home / Self Care | Payer: Medicare Other | Source: Ambulatory Visit | Attending: Cardiology | Admitting: Cardiology

## 2015-06-27 DIAGNOSIS — I25119 Atherosclerotic heart disease of native coronary artery with unspecified angina pectoris: Secondary | ICD-10-CM | POA: Diagnosis not present

## 2015-06-27 DIAGNOSIS — Z48812 Encounter for surgical aftercare following surgery on the circulatory system: Secondary | ICD-10-CM | POA: Diagnosis not present

## 2015-06-27 DIAGNOSIS — Z955 Presence of coronary angioplasty implant and graft: Secondary | ICD-10-CM | POA: Diagnosis not present

## 2015-06-30 ENCOUNTER — Encounter (HOSPITAL_COMMUNITY)
Admission: RE | Admit: 2015-06-30 | Discharge: 2015-06-30 | Disposition: A | Discharge status: Home / Self Care | Payer: Medicare Other | Source: Ambulatory Visit | Attending: Cardiology | Admitting: Cardiology

## 2015-06-30 DIAGNOSIS — Z955 Presence of coronary angioplasty implant and graft: Secondary | ICD-10-CM | POA: Diagnosis not present

## 2015-06-30 DIAGNOSIS — Z48812 Encounter for surgical aftercare following surgery on the circulatory system: Secondary | ICD-10-CM | POA: Diagnosis not present

## 2015-06-30 DIAGNOSIS — I25119 Atherosclerotic heart disease of native coronary artery with unspecified angina pectoris: Secondary | ICD-10-CM | POA: Diagnosis not present

## 2015-07-02 ENCOUNTER — Encounter (HOSPITAL_COMMUNITY)
Admission: RE | Admit: 2015-07-02 | Discharge: 2015-07-02 | Disposition: A | Discharge status: Home / Self Care | Payer: Medicare Other | Source: Ambulatory Visit | Attending: Cardiology | Admitting: Cardiology

## 2015-07-02 DIAGNOSIS — Z955 Presence of coronary angioplasty implant and graft: Secondary | ICD-10-CM | POA: Diagnosis not present

## 2015-07-02 DIAGNOSIS — Z48812 Encounter for surgical aftercare following surgery on the circulatory system: Secondary | ICD-10-CM | POA: Diagnosis not present

## 2015-07-02 DIAGNOSIS — I25119 Atherosclerotic heart disease of native coronary artery with unspecified angina pectoris: Secondary | ICD-10-CM | POA: Diagnosis not present

## 2015-07-04 ENCOUNTER — Encounter (HOSPITAL_COMMUNITY)
Admission: RE | Admit: 2015-07-04 | Discharge: 2015-07-04 | Disposition: A | Discharge status: Home / Self Care | Payer: Medicare Other | Source: Ambulatory Visit | Attending: Cardiology | Admitting: Cardiology

## 2015-07-04 DIAGNOSIS — I25119 Atherosclerotic heart disease of native coronary artery with unspecified angina pectoris: Secondary | ICD-10-CM | POA: Diagnosis not present

## 2015-07-04 DIAGNOSIS — Z48812 Encounter for surgical aftercare following surgery on the circulatory system: Secondary | ICD-10-CM | POA: Diagnosis not present

## 2015-07-04 DIAGNOSIS — Z955 Presence of coronary angioplasty implant and graft: Secondary | ICD-10-CM | POA: Diagnosis not present

## 2015-07-07 ENCOUNTER — Encounter (HOSPITAL_COMMUNITY)
Admission: RE | Admit: 2015-07-07 | Discharge: 2015-07-07 | Disposition: A | Discharge status: Home / Self Care | Payer: Medicare Other | Source: Ambulatory Visit | Attending: Cardiology | Admitting: Cardiology

## 2015-07-07 DIAGNOSIS — I25119 Atherosclerotic heart disease of native coronary artery with unspecified angina pectoris: Secondary | ICD-10-CM | POA: Diagnosis not present

## 2015-07-07 DIAGNOSIS — Z48812 Encounter for surgical aftercare following surgery on the circulatory system: Secondary | ICD-10-CM | POA: Diagnosis not present

## 2015-07-07 DIAGNOSIS — Z955 Presence of coronary angioplasty implant and graft: Secondary | ICD-10-CM | POA: Insufficient documentation

## 2015-07-09 ENCOUNTER — Encounter (HOSPITAL_COMMUNITY)
Admission: RE | Admit: 2015-07-09 | Discharge: 2015-07-09 | Disposition: A | Discharge status: Home / Self Care | Payer: Medicare Other | Source: Ambulatory Visit | Attending: Cardiology | Admitting: Cardiology

## 2015-07-09 DIAGNOSIS — Z48812 Encounter for surgical aftercare following surgery on the circulatory system: Secondary | ICD-10-CM | POA: Diagnosis not present

## 2015-07-09 DIAGNOSIS — I25119 Atherosclerotic heart disease of native coronary artery with unspecified angina pectoris: Secondary | ICD-10-CM | POA: Diagnosis not present

## 2015-07-09 DIAGNOSIS — Z955 Presence of coronary angioplasty implant and graft: Secondary | ICD-10-CM | POA: Diagnosis not present

## 2015-07-10 DIAGNOSIS — Z8546 Personal history of malignant neoplasm of prostate: Secondary | ICD-10-CM | POA: Diagnosis not present

## 2015-07-10 DIAGNOSIS — K219 Gastro-esophageal reflux disease without esophagitis: Secondary | ICD-10-CM | POA: Diagnosis not present

## 2015-07-10 DIAGNOSIS — I2510 Atherosclerotic heart disease of native coronary artery without angina pectoris: Secondary | ICD-10-CM | POA: Diagnosis not present

## 2015-07-10 DIAGNOSIS — E785 Hyperlipidemia, unspecified: Secondary | ICD-10-CM | POA: Diagnosis not present

## 2015-07-10 DIAGNOSIS — I10 Essential (primary) hypertension: Secondary | ICD-10-CM | POA: Diagnosis not present

## 2015-07-10 DIAGNOSIS — Z955 Presence of coronary angioplasty implant and graft: Secondary | ICD-10-CM | POA: Diagnosis not present

## 2015-07-11 ENCOUNTER — Encounter (HOSPITAL_COMMUNITY)
Admission: RE | Admit: 2015-07-11 | Discharge: 2015-07-11 | Disposition: A | Discharge status: Home / Self Care | Payer: Medicare Other | Source: Ambulatory Visit | Attending: Cardiology | Admitting: Cardiology

## 2015-07-11 DIAGNOSIS — Z48812 Encounter for surgical aftercare following surgery on the circulatory system: Secondary | ICD-10-CM | POA: Diagnosis not present

## 2015-07-11 DIAGNOSIS — I25119 Atherosclerotic heart disease of native coronary artery with unspecified angina pectoris: Secondary | ICD-10-CM | POA: Diagnosis not present

## 2015-07-11 DIAGNOSIS — Z955 Presence of coronary angioplasty implant and graft: Secondary | ICD-10-CM | POA: Diagnosis not present

## 2015-07-14 ENCOUNTER — Encounter (HOSPITAL_COMMUNITY)
Admission: RE | Admit: 2015-07-14 | Discharge: 2015-07-14 | Disposition: A | Discharge status: Home / Self Care | Payer: Medicare Other | Source: Ambulatory Visit | Attending: Cardiology | Admitting: Cardiology

## 2015-07-14 DIAGNOSIS — Z48812 Encounter for surgical aftercare following surgery on the circulatory system: Secondary | ICD-10-CM | POA: Diagnosis not present

## 2015-07-14 DIAGNOSIS — I25119 Atherosclerotic heart disease of native coronary artery with unspecified angina pectoris: Secondary | ICD-10-CM | POA: Diagnosis not present

## 2015-07-14 DIAGNOSIS — Z955 Presence of coronary angioplasty implant and graft: Secondary | ICD-10-CM | POA: Diagnosis not present

## 2015-07-16 ENCOUNTER — Encounter (HOSPITAL_COMMUNITY)
Admission: RE | Admit: 2015-07-16 | Discharge: 2015-07-16 | Disposition: A | Discharge status: Home / Self Care | Payer: Medicare Other | Source: Ambulatory Visit | Attending: Cardiology | Admitting: Cardiology

## 2015-07-16 DIAGNOSIS — I25119 Atherosclerotic heart disease of native coronary artery with unspecified angina pectoris: Secondary | ICD-10-CM | POA: Diagnosis not present

## 2015-07-16 DIAGNOSIS — Z955 Presence of coronary angioplasty implant and graft: Secondary | ICD-10-CM | POA: Diagnosis not present

## 2015-07-16 DIAGNOSIS — Z48812 Encounter for surgical aftercare following surgery on the circulatory system: Secondary | ICD-10-CM | POA: Diagnosis not present

## 2015-07-18 ENCOUNTER — Encounter (HOSPITAL_COMMUNITY)
Admission: RE | Admit: 2015-07-18 | Discharge: 2015-07-18 | Disposition: A | Discharge status: Home / Self Care | Payer: Medicare Other | Source: Ambulatory Visit | Attending: Cardiology | Admitting: Cardiology

## 2015-07-18 DIAGNOSIS — I25119 Atherosclerotic heart disease of native coronary artery with unspecified angina pectoris: Secondary | ICD-10-CM | POA: Diagnosis not present

## 2015-07-18 DIAGNOSIS — Z48812 Encounter for surgical aftercare following surgery on the circulatory system: Secondary | ICD-10-CM | POA: Diagnosis not present

## 2015-07-18 DIAGNOSIS — Z955 Presence of coronary angioplasty implant and graft: Secondary | ICD-10-CM | POA: Diagnosis not present

## 2015-07-21 ENCOUNTER — Encounter (HOSPITAL_COMMUNITY)
Admission: RE | Admit: 2015-07-21 | Discharge: 2015-07-21 | Disposition: A | Discharge status: Home / Self Care | Payer: Medicare Other | Source: Ambulatory Visit | Attending: Cardiology | Admitting: Cardiology

## 2015-07-21 DIAGNOSIS — Z955 Presence of coronary angioplasty implant and graft: Secondary | ICD-10-CM | POA: Diagnosis not present

## 2015-07-21 DIAGNOSIS — I25119 Atherosclerotic heart disease of native coronary artery with unspecified angina pectoris: Secondary | ICD-10-CM | POA: Diagnosis not present

## 2015-07-21 DIAGNOSIS — Z48812 Encounter for surgical aftercare following surgery on the circulatory system: Secondary | ICD-10-CM | POA: Diagnosis not present

## 2015-07-23 ENCOUNTER — Encounter (HOSPITAL_COMMUNITY)
Admission: RE | Admit: 2015-07-23 | Discharge: 2015-07-23 | Disposition: A | Discharge status: Home / Self Care | Payer: Medicare Other | Source: Ambulatory Visit | Attending: Cardiology | Admitting: Cardiology

## 2015-07-23 DIAGNOSIS — Z955 Presence of coronary angioplasty implant and graft: Secondary | ICD-10-CM | POA: Diagnosis not present

## 2015-07-23 DIAGNOSIS — Z48812 Encounter for surgical aftercare following surgery on the circulatory system: Secondary | ICD-10-CM | POA: Diagnosis not present

## 2015-07-23 DIAGNOSIS — I25119 Atherosclerotic heart disease of native coronary artery with unspecified angina pectoris: Secondary | ICD-10-CM | POA: Diagnosis not present

## 2015-07-23 NOTE — Progress Notes (Signed)
Pt graduated from cardiac rehab program today with completion of 36 exercise sessions in Phase II. Pt maintained good attendance and progressed nicely during his participation in rehab as evidenced by increased MET level.   Medication list reconciled. Repeat  PHQ score-  .  Pt has made significant lifestyle changes and should be commended for his success. Pt feels he has achieved and exceeded his goals during cardiac rehab.   Pt plans to continue exercise at the gym on the same days he exercised at cardiac rehab. We are proud of Timothy Mendez's progress in the program.

## 2015-07-25 ENCOUNTER — Encounter (HOSPITAL_COMMUNITY): Payer: Medicare Other

## 2015-07-28 ENCOUNTER — Encounter (HOSPITAL_COMMUNITY): Payer: Medicare Other

## 2015-07-30 ENCOUNTER — Encounter (HOSPITAL_COMMUNITY): Payer: Medicare Other

## 2015-08-01 ENCOUNTER — Encounter (HOSPITAL_COMMUNITY): Payer: Medicare Other

## 2015-08-04 ENCOUNTER — Encounter (HOSPITAL_COMMUNITY): Payer: Medicare Other

## 2015-08-05 DIAGNOSIS — Z1389 Encounter for screening for other disorder: Secondary | ICD-10-CM | POA: Diagnosis not present

## 2015-08-05 DIAGNOSIS — E780 Pure hypercholesterolemia: Secondary | ICD-10-CM | POA: Diagnosis not present

## 2015-08-05 DIAGNOSIS — Z79899 Other long term (current) drug therapy: Secondary | ICD-10-CM | POA: Diagnosis not present

## 2015-08-05 DIAGNOSIS — I25110 Atherosclerotic heart disease of native coronary artery with unstable angina pectoris: Secondary | ICD-10-CM | POA: Diagnosis not present

## 2015-08-05 DIAGNOSIS — C61 Malignant neoplasm of prostate: Secondary | ICD-10-CM | POA: Diagnosis not present

## 2015-08-06 ENCOUNTER — Encounter (HOSPITAL_COMMUNITY): Payer: Medicare Other

## 2015-08-06 DIAGNOSIS — C61 Malignant neoplasm of prostate: Secondary | ICD-10-CM | POA: Diagnosis not present

## 2015-08-06 DIAGNOSIS — Z79899 Other long term (current) drug therapy: Secondary | ICD-10-CM | POA: Diagnosis not present

## 2015-08-06 DIAGNOSIS — E780 Pure hypercholesterolemia: Secondary | ICD-10-CM | POA: Diagnosis not present

## 2015-08-08 ENCOUNTER — Encounter (HOSPITAL_COMMUNITY): Payer: Medicare Other

## 2015-08-13 ENCOUNTER — Encounter (HOSPITAL_COMMUNITY): Payer: Medicare Other

## 2015-08-15 ENCOUNTER — Encounter (HOSPITAL_COMMUNITY): Payer: Medicare Other

## 2015-10-01 DIAGNOSIS — Z23 Encounter for immunization: Secondary | ICD-10-CM | POA: Diagnosis not present

## 2015-12-18 DIAGNOSIS — L821 Other seborrheic keratosis: Secondary | ICD-10-CM | POA: Diagnosis not present

## 2015-12-18 DIAGNOSIS — L570 Actinic keratosis: Secondary | ICD-10-CM | POA: Diagnosis not present

## 2015-12-18 DIAGNOSIS — D2262 Melanocytic nevi of left upper limb, including shoulder: Secondary | ICD-10-CM | POA: Diagnosis not present

## 2015-12-18 DIAGNOSIS — D1801 Hemangioma of skin and subcutaneous tissue: Secondary | ICD-10-CM | POA: Diagnosis not present

## 2015-12-18 DIAGNOSIS — D2261 Melanocytic nevi of right upper limb, including shoulder: Secondary | ICD-10-CM | POA: Diagnosis not present

## 2015-12-18 DIAGNOSIS — L812 Freckles: Secondary | ICD-10-CM | POA: Diagnosis not present

## 2016-01-09 ENCOUNTER — Encounter: Admission: RE | Payer: Self-pay | Source: Ambulatory Visit

## 2016-01-09 ENCOUNTER — Ambulatory Visit: Admission: RE | Admit: 2016-01-09 | Payer: Medicare Other | Source: Ambulatory Visit | Admitting: Gastroenterology

## 2016-01-09 SURGERY — COLONOSCOPY WITH PROPOFOL
Anesthesia: General

## 2016-01-16 DIAGNOSIS — I2510 Atherosclerotic heart disease of native coronary artery without angina pectoris: Secondary | ICD-10-CM | POA: Diagnosis not present

## 2016-01-16 DIAGNOSIS — E785 Hyperlipidemia, unspecified: Secondary | ICD-10-CM | POA: Diagnosis not present

## 2016-01-16 DIAGNOSIS — K219 Gastro-esophageal reflux disease without esophagitis: Secondary | ICD-10-CM | POA: Diagnosis not present

## 2016-01-16 DIAGNOSIS — Z955 Presence of coronary angioplasty implant and graft: Secondary | ICD-10-CM | POA: Diagnosis not present

## 2016-01-16 DIAGNOSIS — Z8546 Personal history of malignant neoplasm of prostate: Secondary | ICD-10-CM | POA: Diagnosis not present

## 2016-01-16 DIAGNOSIS — I10 Essential (primary) hypertension: Secondary | ICD-10-CM | POA: Diagnosis not present

## 2016-01-30 DIAGNOSIS — Z1389 Encounter for screening for other disorder: Secondary | ICD-10-CM | POA: Diagnosis not present

## 2016-01-30 DIAGNOSIS — Z8546 Personal history of malignant neoplasm of prostate: Secondary | ICD-10-CM | POA: Diagnosis not present

## 2016-01-30 DIAGNOSIS — E7800 Pure hypercholesterolemia, unspecified: Secondary | ICD-10-CM | POA: Diagnosis not present

## 2016-01-30 DIAGNOSIS — Z0000 Encounter for general adult medical examination without abnormal findings: Secondary | ICD-10-CM | POA: Diagnosis not present

## 2016-01-30 DIAGNOSIS — Z79899 Other long term (current) drug therapy: Secondary | ICD-10-CM | POA: Diagnosis not present

## 2016-02-09 DIAGNOSIS — Z79899 Other long term (current) drug therapy: Secondary | ICD-10-CM | POA: Diagnosis not present

## 2016-02-09 DIAGNOSIS — E7800 Pure hypercholesterolemia, unspecified: Secondary | ICD-10-CM | POA: Diagnosis not present

## 2016-02-09 DIAGNOSIS — Z8546 Personal history of malignant neoplasm of prostate: Secondary | ICD-10-CM | POA: Diagnosis not present

## 2016-02-26 DIAGNOSIS — M1712 Unilateral primary osteoarthritis, left knee: Secondary | ICD-10-CM | POA: Diagnosis not present

## 2016-02-26 DIAGNOSIS — M1710 Unilateral primary osteoarthritis, unspecified knee: Secondary | ICD-10-CM | POA: Diagnosis not present

## 2016-03-11 ENCOUNTER — Encounter: Payer: Self-pay | Admitting: Podiatry

## 2016-03-11 ENCOUNTER — Ambulatory Visit (INDEPENDENT_AMBULATORY_CARE_PROVIDER_SITE_OTHER): Payer: Medicare Other | Admitting: Podiatry

## 2016-03-11 VITALS — BP 134/78 | HR 58 | Resp 16

## 2016-03-11 DIAGNOSIS — L600 Ingrowing nail: Secondary | ICD-10-CM

## 2016-03-11 NOTE — Patient Instructions (Signed)

## 2016-03-12 NOTE — Progress Notes (Signed)
Subjective:     Patient ID: Timothy Mendez, male   DOB: 1946-09-20, 70 y.o.   MRN: [id number]  HPI patient presents with painful ingrown toenail left hallux that he states he's tried to trim and soak without relief   Review of Systems  All other systems reviewed and are negative.      Objective:   Physical Exam  Constitutional: He is oriented to person, place, and time.  Cardiovascular: Intact distal pulses.   Musculoskeletal: Normal range of motion.  Neurological: He is oriented to person, place, and time.  Skin: Skin is warm.  Nursing note and vitals reviewed.  neurovascular status found to be intact muscle strength adequate range of motion within normal limits. Patient found to have incurvated left hallux lateral border that's painful when pressed and makes shoe gear difficult. Patient's tried wider shoes is tried trimming without relief of symptoms. I noted digital perfusion to be immediate bilateral patient well oriented 3     Assessment:     Ingrown toenail deformity left hallux lateral border with pain    Plan:     H&P condition reviewed with patient and recommended removal of corner. Explained procedure and risk and patient wants surgery. I infiltrated the left hallux 60 Milligan times like Marcaine mixture remove the lateral border exposed matrix and applied phenol 3 applications 30 seconds followed by alcohol lavage and sterile dressing. Gave instructions on soaks and reappoint

## 2016-03-23 ENCOUNTER — Telehealth: Payer: Self-pay | Admitting: *Deleted

## 2016-03-23 NOTE — Telephone Encounter (Signed)
Left message for patient at [phone] (Home #) to check to see how they were doing from their ingrown toenail procedure that was performed on Thursday, March 11, 2016. Waiting for a response.

## 2016-04-27 ENCOUNTER — Other Ambulatory Visit: Payer: Self-pay | Admitting: Orthopedic Surgery

## 2016-05-26 DIAGNOSIS — H52223 Regular astigmatism, bilateral: Secondary | ICD-10-CM | POA: Diagnosis not present

## 2016-05-26 DIAGNOSIS — H524 Presbyopia: Secondary | ICD-10-CM | POA: Diagnosis not present

## 2016-05-26 DIAGNOSIS — H2513 Age-related nuclear cataract, bilateral: Secondary | ICD-10-CM | POA: Diagnosis not present

## 2016-05-26 DIAGNOSIS — H5203 Hypermetropia, bilateral: Secondary | ICD-10-CM | POA: Diagnosis not present

## 2016-06-23 DIAGNOSIS — M1712 Unilateral primary osteoarthritis, left knee: Secondary | ICD-10-CM | POA: Diagnosis not present

## 2016-06-23 DIAGNOSIS — G8929 Other chronic pain: Secondary | ICD-10-CM | POA: Diagnosis not present

## 2016-06-23 DIAGNOSIS — M25562 Pain in left knee: Secondary | ICD-10-CM | POA: Diagnosis not present

## 2016-07-12 ENCOUNTER — Encounter (HOSPITAL_COMMUNITY): Payer: Self-pay

## 2016-07-12 NOTE — Pre-Procedure Instructions (Addendum)
Timothy Mendez  07/12/2016      [facility] - [place] - [address] [place] [address] Phone: [phone] Fax: [phone]  [facility] [place], [place] [address] [place] [address] [place] [address] Phone: [phone] Fax: [phone]  [facility] - [place], [place] [address] [place] [address] Phone: [phone] Fax: [phone]    Your procedure is scheduled on Monday, August 21.  Report to [facility] Admitting at 5:30 A.M.  Call this number if you have problems the morning of surgery:  [phone]   Remember:  Do not eat food or drink liquids after midnight.  Take these medicines the morning of surgery with A SIP OF WATER:  metoprolol (lopressor), nitroglycerin if needed   Do not wear jewelry  Do not wear lotions, powders, or colognes.  You may wear deoderant.  Do not shave 48 hours prior to surgery.  Men may shave face and neck.  Do not bring valuables to the hospital.  [facility] is not responsible for any belongings or valuables.  Contacts, dentures or bridgework may not be worn into surgery.  Leave your suitcase in the car.  After surgery it may be brought to your room.  For patients admitted to the hospital, discharge time will be determined by your treatment team.  Patients discharged the day of surgery will not be allowed to drive home.    Special instructions:  LAST DOSE of Plavix, fish oil & aspirin 07/18/2016, unless Dr. Wynonia Lawman tell you otherwise    [facility]- Preparing For Surgery  Before surgery, you can play an important role. Because skin is not sterile, your skin needs to be as free of germs as possible. You can reduce the number of germs on your skin by washing with CHG (chlorahexidine gluconate) Soap before surgery.  CHG is an antiseptic cleaner  which kills germs and bonds with the skin to continue killing germs even after washing.  Please do not use if you have an allergy to CHG or antibacterial soaps. If your skin becomes reddened/irritated stop using the CHG.  Do not shave (including legs and underarms) for at least 48 hours prior to first CHG shower. It is OK to shave your face.  Please follow these instructions carefully.   1. Shower the NIGHT BEFORE SURGERY and the MORNING OF SURGERY with CHG.   2. If you chose to wash your hair, wash your hair first as usual with your normal shampoo.  3. After you shampoo, rinse your hair and body thoroughly to remove the shampoo.  4. Use CHG as you would any other liquid soap. You can apply CHG directly to the skin and wash gently with a scrungie or a clean washcloth.   5. Apply the CHG Soap to your body ONLY FROM THE NECK DOWN.  Do not use on open wounds or open sores. Avoid contact with your eyes, ears, mouth and genitals (private parts). Wash genitals (private parts) with your normal soap.  6. Wash thoroughly, paying special attention to the area where your surgery will be performed.  7. Thoroughly rinse your body with warm water from the neck down.  8. DO NOT shower/wash with your normal soap after using and rinsing off the CHG Soap.  9. Pat yourself dry with a CLEAN TOWEL.   10. Wear CLEAN PAJAMAS  11. Place CLEAN SHEETS on your bed the night of your first shower and DO NOT SLEEP WITH PETS.    Day of Surgery: Do not apply any deodorants/lotions. Please wear clean clothes to the hospital/surgery center.      Please read over the following fact sheets that you were given. Pain Booklet, Coughing and Deep Breathing, Total Joint Packet, MRSA Information and Surgical Site Infection Prevention

## 2016-07-13 ENCOUNTER — Encounter (HOSPITAL_COMMUNITY): Payer: Self-pay

## 2016-07-13 ENCOUNTER — Ambulatory Visit (HOSPITAL_COMMUNITY)
Admission: RE | Admit: 2016-07-13 | Discharge: 2016-07-13 | Disposition: A | Discharge status: Home / Self Care | Payer: Medicare Other | Source: Ambulatory Visit | Attending: Orthopedic Surgery | Admitting: Orthopedic Surgery

## 2016-07-13 ENCOUNTER — Encounter (HOSPITAL_COMMUNITY)
Admission: RE | Admit: 2016-07-13 | Discharge: 2016-07-13 | Disposition: A | Discharge status: Home / Self Care | Payer: Medicare Other | Source: Ambulatory Visit | Attending: Orthopedic Surgery | Admitting: Orthopedic Surgery

## 2016-07-13 DIAGNOSIS — Z01818 Encounter for other preprocedural examination: Secondary | ICD-10-CM | POA: Insufficient documentation

## 2016-07-13 DIAGNOSIS — I2510 Atherosclerotic heart disease of native coronary artery without angina pectoris: Secondary | ICD-10-CM | POA: Insufficient documentation

## 2016-07-13 DIAGNOSIS — R001 Bradycardia, unspecified: Secondary | ICD-10-CM | POA: Diagnosis not present

## 2016-07-13 DIAGNOSIS — Z01810 Encounter for preprocedural cardiovascular examination: Secondary | ICD-10-CM | POA: Insufficient documentation

## 2016-07-13 DIAGNOSIS — Z7902 Long term (current) use of antithrombotics/antiplatelets: Secondary | ICD-10-CM | POA: Diagnosis not present

## 2016-07-13 DIAGNOSIS — I10 Essential (primary) hypertension: Secondary | ICD-10-CM | POA: Diagnosis not present

## 2016-07-13 DIAGNOSIS — Z955 Presence of coronary angioplasty implant and graft: Secondary | ICD-10-CM | POA: Diagnosis not present

## 2016-07-13 DIAGNOSIS — Z7982 Long term (current) use of aspirin: Secondary | ICD-10-CM | POA: Insufficient documentation

## 2016-07-13 DIAGNOSIS — Z01812 Encounter for preprocedural laboratory examination: Secondary | ICD-10-CM | POA: Insufficient documentation

## 2016-07-13 DIAGNOSIS — Z79899 Other long term (current) drug therapy: Secondary | ICD-10-CM | POA: Insufficient documentation

## 2016-07-13 DIAGNOSIS — R918 Other nonspecific abnormal finding of lung field: Secondary | ICD-10-CM | POA: Diagnosis not present

## 2016-07-13 HISTORY — DX: Essential (primary) hypertension: I10

## 2016-07-13 HISTORY — DX: Atherosclerotic heart disease of native coronary artery without angina pectoris: I25.10

## 2016-07-13 LAB — CBC WITH DIFFERENTIAL/PLATELET
BASOS PCT: 1 %
Basophils Absolute: 0 10*3/uL (ref 0.0–0.1)
EOS ABS: 0.1 10*3/uL (ref 0.0–0.7)
EOS PCT: 2 %
HCT: 44.4 % (ref 39.0–52.0)
HEMOGLOBIN: 15.3 g/dL (ref 13.0–17.0)
Lymphocytes Relative: 22 %
Lymphs Abs: 1.3 10*3/uL (ref 0.7–4.0)
MCH: 32.8 pg (ref 26.0–34.0)
MCHC: 34.5 g/dL (ref 30.0–36.0)
MCV: 95.1 fL (ref 78.0–100.0)
Monocytes Absolute: 0.4 10*3/uL (ref 0.1–1.0)
Monocytes Relative: 7 %
NEUTROS PCT: 68 %
Neutro Abs: 3.9 10*3/uL (ref 1.7–7.7)
PLATELETS: 185 10*3/uL (ref 150–400)
RBC: 4.67 MIL/uL (ref 4.22–5.81)
RDW: 12.1 % (ref 11.5–15.5)
WBC: 5.8 10*3/uL (ref 4.0–10.5)

## 2016-07-13 LAB — COMPREHENSIVE METABOLIC PANEL
ALBUMIN: 4.5 g/dL (ref 3.5–5.0)
ALK PHOS: 64 U/L (ref 38–126)
ALT: 27 U/L (ref 17–63)
ANION GAP: 5 (ref 5–15)
AST: 22 U/L (ref 15–41)
BUN: 14 mg/dL (ref 6–20)
CALCIUM: 9.6 mg/dL (ref 8.9–10.3)
CHLORIDE: 104 mmol/L (ref 101–111)
CO2: 31 mmol/L (ref 22–32)
Creatinine, Ser: 0.88 mg/dL (ref 0.61–1.24)
GFR calc non Af Amer: >60 mL/min (ref >60)
GLUCOSE: 89 mg/dL (ref 65–99)
Potassium: 4.2 mmol/L (ref 3.5–5.1)
SODIUM: 140 mmol/L (ref 135–145)
Total Bilirubin: 1.9 mg/dL — ABNORMAL HIGH (ref 0.3–1.2)
Total Protein: 6.9 g/dL (ref 6.5–8.1)

## 2016-07-13 LAB — URINALYSIS, ROUTINE W REFLEX MICROSCOPIC
Bilirubin Urine: NEGATIVE
Glucose, UA: NEGATIVE mg/dL
HGB URINE DIPSTICK: NEGATIVE
Ketones, ur: NEGATIVE mg/dL
LEUKOCYTES UA: NEGATIVE
Nitrite: NEGATIVE
PROTEIN: NEGATIVE mg/dL
SPECIFIC GRAVITY, URINE: 1.014 (ref 1.005–1.030)
pH: 7 (ref 5.0–8.0)

## 2016-07-13 LAB — PROTIME-INR
INR: 1.03
Prothrombin Time: 13.5 seconds (ref 11.4–15.2)

## 2016-07-13 LAB — SURGICAL PCR SCREEN
MRSA, PCR: NEGATIVE
STAPHYLOCOCCUS AUREUS: NEGATIVE

## 2016-07-13 LAB — APTT: APTT: 26 s (ref 24–36)

## 2016-07-13 NOTE — Progress Notes (Signed)
Pt. Verbalized understanding of need to hold Plavix, he agrees to take the last dose of Plavix on 8/13, unless Dr. Wynonia Lawman gives instructions contrary to that.

## 2016-07-13 NOTE — Progress Notes (Signed)
Pt. Will be seen by the PA in Dr. Thurman Coyer office 8/9, then has an appt. With Dr. Wynonia Lawman on 07/23/2016. Pt. Denies chest concerns today, will be faxing request to Dr. Thurman Coyer office for records. Pt. Also followed by Dr. Felipa Eth at [facility].

## 2016-07-14 DIAGNOSIS — M1711 Unilateral primary osteoarthritis, right knee: Secondary | ICD-10-CM | POA: Diagnosis not present

## 2016-07-14 LAB — URINE CULTURE: CULTURE: NO GROWTH

## 2016-07-15 ENCOUNTER — Other Ambulatory Visit (HOSPITAL_COMMUNITY): Payer: Medicare Other

## 2016-07-15 NOTE — Progress Notes (Signed)
Anesthesia Chart Review: Patient is a 70 year old male scheduled for right TKR on 07/26/16 by Dr. Ronnie Derby.   History includes former smoker, GERD, CAD s/p DES RCA 03/14/15, HTN, prostate cancer s/p prostatectomy, tonsillectomy. PCP is Dr. Felipa Eth who signed a note of medical clearance (recieved 03/03/16).   Cardiologist is Dr. Wynonia Lawman, office note received is from 01/16/16. He is reportedly being seen for routine follow-up on 07/23/16, but has already signed a note of cardiac clearance (received on 04/22/16).   Meds include ASA 81 mg, Lipitor, Celebrex, Plavix, Lopressor, MVI, Nitro, fish oil. Last Plavix to be 07/18/16.   07/13/16 EKG: SB at 47 bpm. HR 57 with vitals.  03/14/15 LHC: IMPRESSIONS: 1. Widely patentft main coronary artery. 2. 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 3. Mild disease in the  left circumflex artery and its branches. 4. 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  This was successfully treated with a 3.0 x 38 drug-eluting stent, postdilated to greater than 3.5 mm in diameter.  5. Normal left ventricular systolic function.  LVEDP 22 mmHg. Ejection fraction 60%. RECOMMENDATION:  Continue dual antiplatelet therapy for at least a year. He needs aggressive secondary prevention as well. We'll watch overnight. Likely discharge tomorrow. Stressed the importance of dual antiplatelet therapy.  03/13/15 Cardiac/Chest CT: IMPRESSION: 1. Coronary calcium score of 1906 (LAD 1180, RCA 438, LCX 288). This was 22 percentile for age and sex matched control. 2.  Normal coronary origin, right dominance. 3. There is severe diffuse calcification of all three coronary arteries with two vessel CAD in mid RCA and mid LAD. Cardiac catheterization is recommended. 4.  Moderate concentric left ventricular hypertrophy. 5. 4 mm subpleural nodule in the right middle lobe abutting the minor fissure. Statistically, this is likely to represent a subpleural  lymph node, however, this is nonspecific. If the patient is at high risk for bronchogenic carcinoma, follow-up chest CT at 1 year is recommended. If the patient is at low risk, no follow-up is needed. This recommendation follows the consensus statement: Guidelines for Management of Small Pulmonary Nodules Detected on CT Scans: A Statement from the [place] as published in Radiology 2005; 237:395-400.  07/13/16 CXR: IMPRESSION: No radiographic evidence of acute cardiopulmonary disease. Coronary stenting compatible with known coronary artery disease.  Preoperative labs noted. CMET, CBC, PT/PTT WNL except total bilirubin is elevated at 1.9. Urine culture showed no culture.   Patient has medical and cardiac clearance. Reportedly, he is seeing Dr. Wynonia Lawman for routine 6 month follow-up on 07/23/16. I will ask nursing staff to attempt to get that office note, but since this is just before his surgery I am not sure the note will be readily available. If received, I've asked nursing staff to bring to me for review, but based on currently available information I anticipate that he can proceed as planned if no acute changes.  George Hugh Litzenberg [facility] Short Stay Center/Anesthesiology Phone [phone] 07/15/2016 4:54 PM

## 2016-07-23 DIAGNOSIS — Z8546 Personal history of malignant neoplasm of prostate: Secondary | ICD-10-CM | POA: Diagnosis not present

## 2016-07-23 DIAGNOSIS — E785 Hyperlipidemia, unspecified: Secondary | ICD-10-CM | POA: Diagnosis not present

## 2016-07-23 DIAGNOSIS — I2510 Atherosclerotic heart disease of native coronary artery without angina pectoris: Secondary | ICD-10-CM | POA: Diagnosis not present

## 2016-07-23 DIAGNOSIS — Z955 Presence of coronary angioplasty implant and graft: Secondary | ICD-10-CM | POA: Diagnosis not present

## 2016-07-23 DIAGNOSIS — I10 Essential (primary) hypertension: Secondary | ICD-10-CM | POA: Diagnosis not present

## 2016-07-23 DIAGNOSIS — K219 Gastro-esophageal reflux disease without esophagitis: Secondary | ICD-10-CM | POA: Diagnosis not present

## 2016-07-23 MED ORDER — BUPIVACAINE LIPOSOME 1.3 % IJ SUSP
20.0000 mL | INTRAMUSCULAR | Status: DC
  Filled 2016-07-23: qty 20

## 2016-07-23 MED ORDER — BUPIVACAINE LIPOSOME 1.3 % IJ SUSP
20.0000 mL | INTRAMUSCULAR | Status: AC
  Administered 2016-07-26: 20 mL
  Filled 2016-07-23: qty 20

## 2016-07-23 MED ORDER — TRANEXAMIC ACID 1000 MG/10ML IV SOLN
1000.0000 mg | INTRAVENOUS | Status: AC
  Administered 2016-07-26: 1000 mg via INTRAVENOUS
  Filled 2016-07-23: qty 10

## 2016-07-25 MED ORDER — SODIUM CHLORIDE 0.9 % IV SOLN: INTRAVENOUS | Status: DC

## 2016-07-25 MED ORDER — ACETAMINOPHEN 500 MG PO TABS
1000.0000 mg | ORAL_TABLET | Freq: Once | ORAL | Status: AC
  Administered 2016-07-26: 1000 mg via ORAL
  Filled 2016-07-25: qty 2

## 2016-07-25 MED ORDER — CEFAZOLIN SODIUM-DEXTROSE 2-4 GM/100ML-% IV SOLN
2.0000 g | INTRAVENOUS | Status: AC
  Administered 2016-07-26: 2 g via INTRAVENOUS
  Filled 2016-07-25: qty 100

## 2016-07-25 NOTE — Anesthesia Preprocedure Evaluation (Addendum)
Anesthesia Evaluation  Patient identified by MRN, date of birth, ID band Patient awake    Reviewed: Allergy & Precautions, NPO status , Patient's Chart, lab work & pertinent test results  History of Anesthesia Complications Negative for: history of anesthetic complications  Airway Mallampati: II  TM Distance: >3 FB Neck ROM: Full    Dental  (+) Dental Advisory Given, Teeth Intact   Pulmonary neg shortness of breath, neg sleep apnea, neg COPD, neg recent URI, former smoker,    Pulmonary exam normal breath sounds clear to auscultation       Cardiovascular hypertension, Pt. on medications and Pt. 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  (-) dysrhythmias  Rhythm:Regular Rate:Bradycardia  07/13/16 EKG: SB at 47 bpm. HR 57 with vitals.  03/14/15 LHC: IMPRESSIONS: 1. Widely patentft main coronary artery. 2. 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 3. Mild disease in the left circumflex artery and its branches. 4. 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 This was successfully treated with a 3.0 x 38 drug-eluting stent, postdilated to greater than 3.5 mm in diameter.  5. Normal left ventricular systolic function. LVEDP 22 mmHg. Ejection fraction 60%.   Neuro/Psych neg Seizures negative neurological ROS     GI/Hepatic Neg liver ROS, GERD  Controlled,  Endo/Other  negative endocrine ROSneg diabetes  Renal/GU negative Renal ROS     Musculoskeletal  (+) Arthritis , Osteoarthritis,    Abdominal (+) - obese,   Peds  Hematology negative hematology ROS (+)   Anesthesia Other Findings H/o prostate cancer  Reproductive/Obstetrics                            Anesthesia Physical Anesthesia Plan  ASA: III  Anesthesia Plan:  Spinal and Regional   Post-op Pain Management:  Regional for Post-op pain   Induction:   Airway Management Planned: Natural Airway and Nasal Cannula  Additional Equipment:   Intra-op Plan:   Post-operative Plan:   Informed Consent: I have reviewed the patients History and Physical, chart, labs and discussed the procedure including the risks, benefits and alternatives for the proposed anesthesia with the patient or authorized representative who has indicated his/her understanding and acceptance.   Dental advisory given  Plan Discussed with:   Anesthesia Plan Comments: (I have discussed risks of neuraxial anesthesia including but not limited to infection, bleeding, nerve injury, back pain, headache, seizures, and failure of block. Patient denies bleeding disorders and is not currently anticoagulated. Labs have been reviewed. Risks and benefits discussed. All patient's questions answered.   Last Plavix dose 07/17/16  Hgb 15.8 Platelets 185 INR 1.03)       Anesthesia Quick Evaluation

## 2016-07-26 ENCOUNTER — Inpatient Hospital Stay (HOSPITAL_COMMUNITY)
Admission: RE | Admit: 2016-07-26 | Discharge: 2016-07-27 | DRG: 470 | Disposition: A | Discharge status: Home Health | Payer: Medicare Other | Source: Ambulatory Visit | Attending: Orthopedic Surgery | Admitting: Orthopedic Surgery

## 2016-07-26 ENCOUNTER — Encounter (HOSPITAL_COMMUNITY)
Admission: RE | Disposition: A | Discharge status: Home Health | Payer: Self-pay | Source: Ambulatory Visit | Attending: Orthopedic Surgery

## 2016-07-26 ENCOUNTER — Inpatient Hospital Stay (HOSPITAL_COMMUNITY): Payer: Medicare Other | Admitting: Vascular Surgery

## 2016-07-26 ENCOUNTER — Encounter (HOSPITAL_COMMUNITY): Payer: Self-pay | Admitting: *Deleted

## 2016-07-26 ENCOUNTER — Inpatient Hospital Stay (HOSPITAL_COMMUNITY): Payer: Medicare Other | Admitting: Anesthesiology

## 2016-07-26 DIAGNOSIS — Z7982 Long term (current) use of aspirin: Secondary | ICD-10-CM | POA: Diagnosis not present

## 2016-07-26 DIAGNOSIS — Z96659 Presence of unspecified artificial knee joint: Secondary | ICD-10-CM

## 2016-07-26 DIAGNOSIS — Z87891 Personal history of nicotine dependence: Secondary | ICD-10-CM | POA: Diagnosis not present

## 2016-07-26 DIAGNOSIS — Z7902 Long term (current) use of antithrombotics/antiplatelets: Secondary | ICD-10-CM

## 2016-07-26 DIAGNOSIS — Z8546 Personal history of malignant neoplasm of prostate: Secondary | ICD-10-CM

## 2016-07-26 DIAGNOSIS — Z955 Presence of coronary angioplasty implant and graft: Secondary | ICD-10-CM | POA: Diagnosis not present

## 2016-07-26 DIAGNOSIS — I25119 Atherosclerotic heart disease of native coronary artery with unspecified angina pectoris: Secondary | ICD-10-CM | POA: Diagnosis present

## 2016-07-26 DIAGNOSIS — M179 Osteoarthritis of knee, unspecified: Secondary | ICD-10-CM | POA: Diagnosis not present

## 2016-07-26 DIAGNOSIS — M25561 Pain in right knee: Secondary | ICD-10-CM | POA: Diagnosis not present

## 2016-07-26 DIAGNOSIS — I10 Essential (primary) hypertension: Secondary | ICD-10-CM | POA: Diagnosis not present

## 2016-07-26 DIAGNOSIS — Z79899 Other long term (current) drug therapy: Secondary | ICD-10-CM

## 2016-07-26 DIAGNOSIS — M1711 Unilateral primary osteoarthritis, right knee: Principal | ICD-10-CM | POA: Diagnosis present

## 2016-07-26 DIAGNOSIS — G8918 Other acute postprocedural pain: Secondary | ICD-10-CM | POA: Diagnosis not present

## 2016-07-26 HISTORY — PX: TOTAL KNEE ARTHROPLASTY: SHX125

## 2016-07-26 SURGERY — ARTHROPLASTY, KNEE, TOTAL
Anesthesia: Regional | Site: Knee | Laterality: Right

## 2016-07-26 MED ORDER — NITROGLYCERIN 0.4 MG SL SUBL: 0.4000 mg | SUBLINGUAL_TABLET | SUBLINGUAL | Status: DC | PRN

## 2016-07-26 MED ORDER — MIDAZOLAM HCL 2 MG/2ML IJ SOLN
INTRAMUSCULAR | Status: AC
  Filled 2016-07-26: qty 2

## 2016-07-26 MED ORDER — LIDOCAINE 2% (20 MG/ML) 5 ML SYRINGE
INTRAMUSCULAR | Status: AC
  Filled 2016-07-26: qty 5

## 2016-07-26 MED ORDER — TRANEXAMIC ACID 1000 MG/10ML IV SOLN
1000.0000 mg | Freq: Once | INTRAVENOUS | Status: AC
  Administered 2016-07-26: 1000 mg via INTRAVENOUS
  Filled 2016-07-26: qty 10

## 2016-07-26 MED ORDER — ROPIVACAINE HCL 5 MG/ML IJ SOLN
INTRAMUSCULAR | Status: DC | PRN
  Administered 2016-07-26: 30 mL via PERINEURAL

## 2016-07-26 MED ORDER — BUPIVACAINE-EPINEPHRINE (PF) 0.25% -1:200000 IJ SOLN
INTRAMUSCULAR | Status: DC | PRN
Start: 2016-07-26 — End: 2016-07-26
  Administered 2016-07-26: 30 mL

## 2016-07-26 MED ORDER — ASPIRIN EC 325 MG PO TBEC
325.0000 mg | DELAYED_RELEASE_TABLET | Freq: Every day | ORAL | Status: DC
  Administered 2016-07-27: 325 mg via ORAL
  Filled 2016-07-26: qty 1

## 2016-07-26 MED ORDER — PHENOL 1.4 % MT LIQD: 1.0000 | OROMUCOSAL | Status: DC | PRN

## 2016-07-26 MED ORDER — PROPOFOL 500 MG/50ML IV EMUL
INTRAVENOUS | Status: DC | PRN
  Administered 2016-07-26: 50 ug/kg/min via INTRAVENOUS

## 2016-07-26 MED ORDER — ACETAMINOPHEN 325 MG PO TABS: 650.0000 mg | ORAL_TABLET | Freq: Four times a day (QID) | ORAL | Status: DC | PRN

## 2016-07-26 MED ORDER — OXYCODONE HCL ER 10 MG PO T12A
10.0000 mg | EXTENDED_RELEASE_TABLET | Freq: Two times a day (BID) | ORAL | Status: DC
  Administered 2016-07-26 – 2016-07-27 (×3): 10 mg via ORAL
  Filled 2016-07-26 (×3): qty 1

## 2016-07-26 MED ORDER — FENTANYL CITRATE (PF) 100 MCG/2ML IJ SOLN
INTRAMUSCULAR | Status: AC
  Filled 2016-07-26: qty 2

## 2016-07-26 MED ORDER — METHOCARBAMOL 1000 MG/10ML IJ SOLN
500.0000 mg | Freq: Four times a day (QID) | INTRAVENOUS | Status: DC | PRN
  Filled 2016-07-26: qty 5

## 2016-07-26 MED ORDER — HYDROMORPHONE HCL 1 MG/ML IJ SOLN
1.0000 mg | INTRAMUSCULAR | Status: DC | PRN
  Administered 2016-07-26 – 2016-07-27 (×2): 1 mg via INTRAVENOUS
  Filled 2016-07-26 (×2): qty 1

## 2016-07-26 MED ORDER — SODIUM CHLORIDE 0.9 % IR SOLN
Status: DC | PRN
  Administered 2016-07-26: 1000 mL

## 2016-07-26 MED ORDER — FENTANYL CITRATE (PF) 100 MCG/2ML IJ SOLN
25.0000 ug | INTRAMUSCULAR | Status: DC | PRN
  Administered 2016-07-26 (×4): 25 ug via INTRAVENOUS

## 2016-07-26 MED ORDER — ONDANSETRON HCL 4 MG/2ML IJ SOLN
INTRAMUSCULAR | Status: DC | PRN
  Administered 2016-07-26: 4 mg via INTRAVENOUS

## 2016-07-26 MED ORDER — PROPOFOL 10 MG/ML IV BOLUS
INTRAVENOUS | Status: AC
  Filled 2016-07-26: qty 20

## 2016-07-26 MED ORDER — MIDAZOLAM HCL 5 MG/5ML IJ SOLN
INTRAMUSCULAR | Status: DC | PRN
  Administered 2016-07-26 (×2): 1 mg via INTRAVENOUS

## 2016-07-26 MED ORDER — ONDANSETRON HCL 4 MG/2ML IJ SOLN
INTRAMUSCULAR | Status: AC
  Filled 2016-07-26: qty 2

## 2016-07-26 MED ORDER — OXYCODONE HCL 5 MG PO TABS
5.0000 mg | ORAL_TABLET | ORAL | Status: DC | PRN
  Administered 2016-07-26 – 2016-07-27 (×5): 10 mg via ORAL
  Filled 2016-07-26 (×5): qty 2

## 2016-07-26 MED ORDER — CHLORHEXIDINE GLUCONATE 4 % EX LIQD: 60.0000 mL | Freq: Once | CUTANEOUS | Status: DC

## 2016-07-26 MED ORDER — ACETAMINOPHEN 650 MG RE SUPP: 650.0000 mg | Freq: Four times a day (QID) | RECTAL | Status: DC | PRN

## 2016-07-26 MED ORDER — SODIUM CHLORIDE 0.9 % IJ SOLN
INTRAMUSCULAR | Status: DC | PRN
  Administered 2016-07-26: 20 mL

## 2016-07-26 MED ORDER — LACTATED RINGERS IV SOLN
INTRAVENOUS | Status: DC | PRN
  Administered 2016-07-26 (×2): via INTRAVENOUS

## 2016-07-26 MED ORDER — MENTHOL 3 MG MT LOZG: 1.0000 | LOZENGE | OROMUCOSAL | Status: DC | PRN

## 2016-07-26 MED ORDER — METOCLOPRAMIDE HCL 5 MG/ML IJ SOLN: 5.0000 mg | Freq: Three times a day (TID) | INTRAMUSCULAR | Status: DC | PRN

## 2016-07-26 MED ORDER — FLEET ENEMA 7-19 GM/118ML RE ENEM: 1.0000 | ENEMA | Freq: Once | RECTAL | Status: DC | PRN

## 2016-07-26 MED ORDER — CLOPIDOGREL BISULFATE 75 MG PO TABS
75.0000 mg | ORAL_TABLET | Freq: Every day | ORAL | Status: DC
  Administered 2016-07-27: 75 mg via ORAL
  Filled 2016-07-26: qty 1

## 2016-07-26 MED ORDER — ACETAMINOPHEN 500 MG PO TABS
1000.0000 mg | ORAL_TABLET | Freq: Four times a day (QID) | ORAL | Status: AC
  Administered 2016-07-26 – 2016-07-27 (×3): 1000 mg via ORAL
  Filled 2016-07-26 (×3): qty 2

## 2016-07-26 MED ORDER — ALUM & MAG HYDROXIDE-SIMETH 200-200-20 MG/5ML PO SUSP: 30.0000 mL | ORAL | Status: DC | PRN

## 2016-07-26 MED ORDER — METOPROLOL TARTRATE 100 MG PO TABS
100.0000 mg | ORAL_TABLET | Freq: Two times a day (BID) | ORAL | Status: DC
  Administered 2016-07-26 – 2016-07-27 (×2): 100 mg via ORAL
  Filled 2016-07-26 (×2): qty 1

## 2016-07-26 MED ORDER — BUPIVACAINE-EPINEPHRINE (PF) 0.25% -1:200000 IJ SOLN
INTRAMUSCULAR | Status: AC
  Filled 2016-07-26: qty 30

## 2016-07-26 MED ORDER — DEXAMETHASONE SODIUM PHOSPHATE 10 MG/ML IJ SOLN
10.0000 mg | Freq: Once | INTRAMUSCULAR | Status: AC
  Administered 2016-07-27: 10 mg via INTRAVENOUS
  Filled 2016-07-26: qty 1

## 2016-07-26 MED ORDER — METHOCARBAMOL 500 MG PO TABS
500.0000 mg | ORAL_TABLET | Freq: Four times a day (QID) | ORAL | Status: DC | PRN
  Administered 2016-07-26 – 2016-07-27 (×4): 500 mg via ORAL
  Filled 2016-07-26 (×4): qty 1

## 2016-07-26 MED ORDER — ONDANSETRON HCL 4 MG PO TABS: 4.0000 mg | ORAL_TABLET | Freq: Four times a day (QID) | ORAL | Status: DC | PRN

## 2016-07-26 MED ORDER — CEFAZOLIN IN D5W 1 GM/50ML IV SOLN
1.0000 g | Freq: Four times a day (QID) | INTRAVENOUS | Status: AC
  Administered 2016-07-26 (×2): 1 g via INTRAVENOUS
  Filled 2016-07-26 (×2): qty 50

## 2016-07-26 MED ORDER — METOCLOPRAMIDE HCL 5 MG PO TABS: 5.0000 mg | ORAL_TABLET | Freq: Three times a day (TID) | ORAL | Status: DC | PRN

## 2016-07-26 MED ORDER — BISACODYL 5 MG PO TBEC: 5.0000 mg | DELAYED_RELEASE_TABLET | Freq: Every day | ORAL | Status: DC | PRN

## 2016-07-26 MED ORDER — DOCUSATE SODIUM 100 MG PO CAPS
100.0000 mg | ORAL_CAPSULE | Freq: Two times a day (BID) | ORAL | Status: DC
  Administered 2016-07-26 – 2016-07-27 (×3): 100 mg via ORAL
  Filled 2016-07-26 (×3): qty 1

## 2016-07-26 MED ORDER — FENTANYL CITRATE (PF) 100 MCG/2ML IJ SOLN
INTRAMUSCULAR | Status: DC | PRN
  Administered 2016-07-26 (×2): 50 ug via INTRAVENOUS

## 2016-07-26 MED ORDER — BUPIVACAINE IN DEXTROSE 0.75-8.25 % IT SOLN
INTRATHECAL | Status: DC | PRN
  Administered 2016-07-26: 1.8 mL via INTRATHECAL

## 2016-07-26 MED ORDER — SENNOSIDES-DOCUSATE SODIUM 8.6-50 MG PO TABS: 1.0000 | ORAL_TABLET | Freq: Every evening | ORAL | Status: DC | PRN

## 2016-07-26 MED ORDER — ONDANSETRON HCL 4 MG/2ML IJ SOLN: 4.0000 mg | Freq: Four times a day (QID) | INTRAMUSCULAR | Status: DC | PRN

## 2016-07-26 MED ORDER — ONDANSETRON HCL 4 MG/2ML IJ SOLN: 4.0000 mg | Freq: Once | INTRAMUSCULAR | Status: DC | PRN

## 2016-07-26 MED ORDER — ATORVASTATIN CALCIUM 20 MG PO TABS
20.0000 mg | ORAL_TABLET | Freq: Every day | ORAL | Status: DC
  Administered 2016-07-26: 20 mg via ORAL
  Filled 2016-07-26: qty 1

## 2016-07-26 MED ORDER — SODIUM CHLORIDE 0.9 % IV SOLN
INTRAVENOUS | Status: DC
  Administered 2016-07-26: 12:00:00 via INTRAVENOUS

## 2016-07-26 MED ORDER — ZOLPIDEM TARTRATE 5 MG PO TABS: 5.0000 mg | ORAL_TABLET | Freq: Every evening | ORAL | Status: DC | PRN

## 2016-07-26 SURGICAL SUPPLY — 63 items
BANDAGE ESMARK 6X9 LF (GAUZE/BANDAGES/DRESSINGS) ×1 IMPLANT
BLADE SAGITTAL 13X1.27X60 (BLADE) ×2 IMPLANT
BLADE SAGITTAL 13X1.27X60MM (BLADE) ×1
BLADE SAW SGTL 83.5X18.5 (BLADE) ×3 IMPLANT
BLADE SURG 10 STRL SS (BLADE) ×3 IMPLANT
BNDG CMPR 9X6 STRL LF SNTH (GAUZE/BANDAGES/DRESSINGS) ×1
BNDG CMPR MED 10X6 ELC LF (GAUZE/BANDAGES/DRESSINGS) ×1
BNDG ELASTIC 6X10 VLCR STRL LF (GAUZE/BANDAGES/DRESSINGS) ×2 IMPLANT
BNDG ESMARK 6X9 LF (GAUZE/BANDAGES/DRESSINGS) ×3
BOWL SMART MIX CTS (DISPOSABLE) ×3 IMPLANT
CAPT KNEE TOTAL 3 ×3 IMPLANT
CEMENT BONE SIMPLEX SPEEDSET (Cement) ×6 IMPLANT
CLOSURE STERI-STRIP 1/2X4 (GAUZE/BANDAGES/DRESSINGS) ×1
CLOSURE WOUND 1/2 X4 (GAUZE/BANDAGES/DRESSINGS) ×1
CLSR STERI-STRIP ANTIMIC 1/2X4 (GAUZE/BANDAGES/DRESSINGS) ×1 IMPLANT
COVER SURGICAL LIGHT HANDLE (MISCELLANEOUS) ×3 IMPLANT
CUFF TOURNIQUET SINGLE 34IN LL (TOURNIQUET CUFF) ×3 IMPLANT
DRAPE EXTREMITY T 121X128X90 (DRAPE) ×3 IMPLANT
DRAPE INCISE IOBAN 66X45 STRL (DRAPES) ×6 IMPLANT
DRAPE PROXIMA HALF (DRAPES) IMPLANT
DRAPE U-SHAPE 47X51 STRL (DRAPES) ×3 IMPLANT
DRSG AQUACEL AG ADV 3.5X10 (GAUZE/BANDAGES/DRESSINGS) ×3 IMPLANT
DRSG PAD ABDOMINAL 8X10 ST (GAUZE/BANDAGES/DRESSINGS) ×3 IMPLANT
DURAPREP 26ML APPLICATOR (WOUND CARE) ×6 IMPLANT
ELECT REM PT RETURN 9FT ADLT (ELECTROSURGICAL) ×3
ELECTRODE REM PT RTRN 9FT ADLT (ELECTROSURGICAL) ×1 IMPLANT
GLOVE BIOGEL M 7.0 STRL (GLOVE) IMPLANT
GLOVE BIOGEL PI IND STRL 7.5 (GLOVE) IMPLANT
GLOVE BIOGEL PI IND STRL 8.5 (GLOVE) ×5 IMPLANT
GLOVE BIOGEL PI INDICATOR 7.5 (GLOVE)
GLOVE BIOGEL PI INDICATOR 8.5 (GLOVE) ×10
GLOVE SURG ORTHO 8.0 STRL STRW (GLOVE) ×18 IMPLANT
GOWN STRL REUS W/ TWL LRG LVL3 (GOWN DISPOSABLE) ×1 IMPLANT
GOWN STRL REUS W/ TWL XL LVL3 (GOWN DISPOSABLE) ×2 IMPLANT
GOWN STRL REUS W/TWL 2XL LVL3 (GOWN DISPOSABLE) ×3 IMPLANT
GOWN STRL REUS W/TWL LRG LVL3 (GOWN DISPOSABLE) ×3
GOWN STRL REUS W/TWL XL LVL3 (GOWN DISPOSABLE) ×6
HANDPIECE INTERPULSE COAX TIP (DISPOSABLE) ×3
HOOD PEEL AWAY FACE SHEILD DIS (HOOD) ×9 IMPLANT
KIT BASIN OR (CUSTOM PROCEDURE TRAY) ×3 IMPLANT
KIT ROOM TURNOVER OR (KITS) ×3 IMPLANT
KNEE CAPITATED TOTAL 3 ×1 IMPLANT
MANIFOLD NEPTUNE II (INSTRUMENTS) ×3 IMPLANT
NEEDLE 22X1 1/2 (OR ONLY) (NEEDLE) ×6 IMPLANT
NS IRRIG 1000ML POUR BTL (IV SOLUTION) ×3 IMPLANT
PACK TOTAL JOINT (CUSTOM PROCEDURE TRAY) ×3 IMPLANT
PACK UNIVERSAL I (CUSTOM PROCEDURE TRAY) ×3 IMPLANT
PAD ARMBOARD 7.5X6 YLW CONV (MISCELLANEOUS) ×6 IMPLANT
SET HNDPC FAN SPRY TIP SCT (DISPOSABLE) ×1 IMPLANT
STAPLER VISISTAT 35W (STAPLE) ×3 IMPLANT
STRIP CLOSURE SKIN 1/2X4 (GAUZE/BANDAGES/DRESSINGS) ×2 IMPLANT
SUCTION FRAZIER HANDLE 10FR (MISCELLANEOUS) ×2
SUCTION TUBE FRAZIER 10FR DISP (MISCELLANEOUS) ×1 IMPLANT
SUT BONE WAX W31G (SUTURE) ×3 IMPLANT
SUT VIC AB 0 CTB1 27 (SUTURE) ×6 IMPLANT
SUT VIC AB 1 CT1 27 (SUTURE) ×6
SUT VIC AB 1 CT1 27XBRD ANBCTR (SUTURE) ×2 IMPLANT
SUT VIC AB 2-0 CT1 27 (SUTURE) ×6
SUT VIC AB 2-0 CT1 TAPERPNT 27 (SUTURE) ×2 IMPLANT
SYR 20CC LL (SYRINGE) ×6 IMPLANT
TOWEL OR 17X24 6PK STRL BLUE (TOWEL DISPOSABLE) ×3 IMPLANT
TOWEL OR 17X26 10 PK STRL BLUE (TOWEL DISPOSABLE) ×3 IMPLANT
WATER STERILE IRR 1000ML POUR (IV SOLUTION) ×6 IMPLANT

## 2016-07-26 NOTE — Transfer of Care (Signed)
Immediate Anesthesia Transfer of Care Note  Patient: Timothy Mendez  Procedure(s) Performed: Procedure(s): RIGHT TOTAL KNEE ARTHROPLASTY (Right)  Patient Location: PACU  Anesthesia Type:MAC and Spinal  Level of Consciousness: awake  Airway & Oxygen Therapy: Patient Spontanous Breathing and Patient connected to nasal cannula oxygen  Post-op Assessment: Report given to RN  Post vital signs: Reviewed and stable  Last Vitals:  Vitals:   07/26/16 0631  BP: (!) 166/66  Pulse: (!) 53  Resp: 20  Temp: 36.5 C    Last Pain:  Vitals:   07/26/16 0631  TempSrc: Oral      Patients Stated Pain Goal: 2 ([id number])  Complications: No apparent anesthesia complications

## 2016-07-26 NOTE — Evaluation (Signed)
Physical Therapy Evaluation Patient Details Name: Timothy Mendez MRN: [id number] DOB: Apr 11, 1946 Today's Date: 07/26/2016   History of Present Illness  Timothy Mendez is a 70 yo male admitted s/p R TKA. PMH includes prostate cancer, OA, HTN, and CAD.  Clinical Impression  Pt admitted s/p R TKA and presents with deficits listed below. Educated on knee precautions. Pt with decreased sensation in R LE due to nerve block limiting control of R knee standing mobility. Required max A to stand and gain balance and mod A for stand pivot transfer. Performed LE exercises and educated on performing these every waking hour for strength and mobility. Pt was independent with ADLs and live at home with wife who is available for assistance as necessary. Pt motivated to participate in PT. PT recommends HH PT to strength, mobility, and gait training for safe return home. Continue acute follow.    Follow Up Recommendations Home health PT;Supervision/Assistance - 24 hour    Equipment Recommendations  None recommended by PT    Recommendations for Other Services       Precautions / Restrictions Precautions Precautions: Fall;Knee Precaution Comments: No pillow behind knee. Maintain bone foam for knee extension. Restrictions Weight Bearing Restrictions: Yes RLE Weight Bearing: Weight bearing as tolerated      Mobility  Bed Mobility Overal bed mobility: Needs Assistance Bed Mobility: Supine to Sit     Supine to sit: Min guard;HOB elevated     General bed mobility comments: Pt relied heavily on bed rails and needed increased time for bed mobility to EOB,  Transfers Overall transfer level: Needs assistance Equipment used: Rolling walker (2 wheeled) Transfers: Sit to/from [facility] Sit to Stand: Max assist Stand pivot transfers: Mod assist       General transfer comment: Pt required verbal cues for hand push off bed and max A to power up to standing and to gain balance due  to decreased sensation in R LE. Pt reports his R leg "has a mind of its own". Pt states WB 30-40% on R LE with use of bilat UE on RW.  Mod A for balance and verbal cues for walker management during stand pivot transfer and hand placement stand>sit.   Ambulation/Gait             General Gait Details: not attempted due to decreased sensation and control of R LE  Stairs            Wheelchair Mobility    Modified Rankin (Stroke Patients Only)       Balance Overall balance assessment: Needs assistance Sitting-balance support: No upper extremity supported;Feet supported Sitting balance-Leahy Scale: Good     Standing balance support: Bilateral upper extremity supported Standing balance-Leahy Scale: Poor Standing balance comment: Required bilat UE support for balance                             Pertinent Vitals/Pain Pain Assessment: 0-10 Pain Score: 2  Pain Location: R knee Pain Descriptors / Indicators: Operative site guarding;Tingling Pain Intervention(s): Premedicated before session;Monitored during session    Home Living Family/patient expects to be discharged to:: Private residence Living Arrangements: Spouse/significant other Available Help at Discharge: Family Type of Home: House Home Access: Level entry     Home Layout: Able to live on main level with bedroom/bathroom Home Equipment: Walker - 2 wheels;Bedside commode;Shower seat - built in;Tub bench      Prior Function Level of Independence: Independent  Comments: Pt independent with ADLs and driving.     Hand Dominance   Dominant Hand: Right    Extremity/Trunk Assessment   Upper Extremity Assessment: Overall WFL for tasks assessed           Lower Extremity Assessment: RLE deficits/detail RLE Deficits / Details: s/p R TKA. Able to perform R quad set.    Cervical / Trunk Assessment: Normal  Communication   Communication: No difficulties  Cognition Arousal/Alertness:  Awake/alert Behavior During Therapy: WFL for tasks assessed/performed Overall Cognitive Status: Within Functional Limits for tasks assessed                      General Comments General comments (skin integrity, edema, etc.): Pt states he is frustrated that he can't do more this session. Educated that healing takes time and will be able to do more once the nerve block wears off.     Exercises Total Joint Exercises Ankle Circles/Pumps: AROM;10 reps;Both;Seated Quad Sets: AROM;Right;10 reps;Seated Gluteal Sets: AROM;Both;10 reps;Seated      Assessment/Plan    PT Assessment Patient needs continued PT services  PT Diagnosis Difficulty walking;Abnormality of gait;Generalized weakness;Acute pain   PT Problem List Decreased strength;Decreased range of motion;Decreased activity tolerance;Decreased balance;Decreased mobility;Decreased coordination;Decreased knowledge of use of DME;Decreased safety awareness;Decreased knowledge of precautions;Pain;Impaired sensation  PT Treatment Interventions DME instruction;Gait training;Stair training;Functional mobility training;Therapeutic exercise;Therapeutic activities;Balance training;Neuromuscular re-education;Patient/family education   PT Goals (Current goals can be found in the Care Plan section) Acute Rehab PT Goals Patient Stated Goal: to get stronger and be independent PT Goal Formulation: With patient/family Time For Goal Achievement: 08/02/16 Potential to Achieve Goals: Good    Frequency 7X/week   Barriers to discharge        Co-evaluation               End of Session Equipment Utilized During Treatment: Gait belt Activity Tolerance: Patient tolerated treatment well Patient left: in chair;with call bell/phone within reach;with family/visitor present Nurse Communication: Mobility status         Time: 1345-1410 PT Time Calculation (min) (ACUTE ONLY): 25 min   Charges:   PT Evaluation $PT Eval Moderate Complexity:  1 Procedure PT Treatments $Therapeutic Activity: 8-22 mins   PT G Codes:        Kana Reimann 07-30-2016, 3:52 PM Tawni Millers, SPT (student physical therapist) [place] [phone]

## 2016-07-26 NOTE — Anesthesia Procedure Notes (Signed)
Procedure Name: MAC Date/Time: 07/26/2016 7:35 AM Performed by: Barrington Ellison Pre-anesthesia Checklist: Patient identified, Emergency Drugs available, Suction available, Patient being monitored and Timeout performed Patient Re-evaluated:Patient Re-evaluated prior to inductionOxygen Delivery Method: Simple face mask

## 2016-07-26 NOTE — Progress Notes (Signed)
Orthopedic Tech Progress Note Patient Details:  Timothy Mendez 1946-09-10 [id number]  CPM Right Knee CPM Right Knee: On Right Knee Flexion (Degrees): 90 Right Knee Extension (Degrees): 0 Additional Comments: Trapeze bar and foot roll   [place] Pink 07/26/2016, 11:02 AM

## 2016-07-26 NOTE — Anesthesia Postprocedure Evaluation (Signed)
Anesthesia Post Note  Patient: Timothy Mendez  Procedure(s) Performed: Procedure(s) (LRB): RIGHT TOTAL KNEE ARTHROPLASTY (Right)  Patient location during evaluation: PACU Anesthesia Type: Spinal Level of consciousness: oriented and awake and alert Pain management: pain level controlled Vital Signs Assessment: post-procedure vital signs reviewed and stable Respiratory status: spontaneous breathing, respiratory function stable and patient connected to nasal cannula oxygen Cardiovascular status: blood pressure returned to baseline and stable Postop Assessment: no headache and no backache Anesthetic complications: no    Last Vitals:  Vitals:   07/26/16 1045 07/26/16 1054  BP:    Pulse: (!) 50 (!) 48  Resp: 16 11  Temp: 36.5 C     Last Pain:  Vitals:   07/26/16 1118  TempSrc:   PainSc: 2                  Nilda Simmer

## 2016-07-26 NOTE — Anesthesia Procedure Notes (Signed)
Spinal  Start time: 07/26/2016 7:40 AM End time: 07/26/2016 7:45 AM Staffing Anesthesiologist: Nilda Simmer Performed: anesthesiologist  Preanesthetic Checklist Completed: patient identified, surgical consent, pre-op evaluation, timeout performed, IV checked, risks and benefits discussed and monitors and equipment checked Spinal Block Patient position: sitting Prep: DuraPrep Patient monitoring: heart rate, cardiac monitor, continuous pulse ox and blood pressure Approach: midline Location: L2-3 Injection technique: single-shot Needle Needle type: Pencan  Needle gauge: 24 G Needle length: 9 cm Needle insertion depth: 6 cm

## 2016-07-26 NOTE — Anesthesia Procedure Notes (Signed)
Anesthesia Regional Block:  Adductor canal block  Pre-Anesthetic Checklist: ,, timeout performed, Correct Patient, Correct Site, Correct Laterality, Correct Procedure, Correct Position, site marked, Risks and benefits discussed,  Surgical consent,  Pre-op evaluation,  At surgeon's request and post-op pain management  Laterality: Right  Prep: chloraprep       Needles:  Injection technique: Single-shot  Needle Type: Echogenic Stimulator Needle     Needle Length: 9cm 9 cm Needle Gauge: 21 and 21 G    Additional Needles:  Procedures: ultrasound guided (picture in chart) Adductor canal block Narrative:  Start time: 07/26/2016 7:10 AM End time: 07/26/2016 7:13 AM Injection made incrementally with aspirations every 5 mL.  Performed by: Personally  Anesthesiologist: Nilda Simmer  Additional Notes: Preop: Patient denies current pain and motor weakness. Endorses numbness over the anterior part of his knee.

## 2016-07-26 NOTE — H&P (Signed)
Timothy Mendez MRN:  [id number] DOB/SEX:  May 26, 1946/male  CHIEF COMPLAINT:  Painful right Knee  HISTORY: Patient is a 70 y.o. male presented with a history of pain in the right knee. Onset of symptoms was gradual starting a few years ago with gradually worsening course since that time. Patient has been treated conservatively with over-the-counter NSAIDs and activity modification. Patient currently rates pain in the knee at 10 out of 10 with activity. There is pain at night.  PAST MEDICAL HISTORY: Patient Active Problem List   Diagnosis Date Noted  . Coronary artery disease involving native coronary artery of native heart with angina pectoris ([place])   . Chest pain 03/14/2015  . Pain in the chest   . Hypertension 03/13/2015  . CAD (coronary artery disease), native coronary artery 03/13/2015  . Unstable angina pectoris ([place])   . History of prostate cancer    Past Medical History:  Diagnosis Date  . Anginal pain ([place])   . Coronary artery disease   . GERD (gastroesophageal reflux disease)   . Hypertension   . Osteoarthritis    knees  . Prostate cancer [facility])    Past Surgical History:  Procedure Laterality Date  . CARDIAC CATHETERIZATION  03/14/2015   Procedure: CORONARY STENT INTERVENTION;  Surgeon: Jettie Booze, MD;  Location: [facility] CATH LAB;  Service: Cardiovascular;;  Prox RCA  . CORONARY ANGIOPLASTY WITH STENT PLACEMENT  03/14/2015   "1"  . INGUINAL HERNIA REPAIR Right    as a child  . KNEE ARTHROSCOPY Right X 2  . LEFT HEART CATHETERIZATION WITH CORONARY ANGIOGRAM N/A 03/14/2015   Procedure: LEFT HEART CATHETERIZATION WITH CORONARY ANGIOGRAM;  Surgeon: Jettie Booze, MD;  Location: [facility] CATH LAB;  Service: Cardiovascular;  Laterality: N/A;  . PROSTATECTOMY  12/2000   Archie Endo 04/21/2011  . SHOULDER ARTHROSCOPY Right 09/2007   Archie Endo 04/07/2011  . TONSILLECTOMY       MEDICATIONS:   Prescriptions Prior to Admission  Medication Sig Dispense Refill Last Dose  .  acetaminophen (TYLENOL) 500 MG tablet Take 500 mg by mouth every 6 (six) hours as needed.   Past Week at Unknown time  . aspirin EC 81 MG tablet Take 81 mg by mouth daily.   07/17/16  . atorvastatin (LIPITOR) 40 MG tablet Take 1 tablet (40 mg total) by mouth daily at 6 PM. (Patient taking differently: Take 20 mg by mouth daily at 6 PM. ) 30 tablet 0 Taking  . celecoxib (CELEBREX) 200 MG capsule Take 200 mg by mouth 2 (two) times daily as needed for moderate pain.    Past Month at Unknown time  . Cholecalciferol (VITAMIN D PO) Take 1 tablet by mouth daily.   07/25/2016 at Unknown time  . clopidogrel (PLAVIX) 75 MG tablet Take 1 tablet (75 mg total) by mouth daily with breakfast. 30 tablet 0 07/17/16  . Glucosamine-Chondroitin (GLUCOSAMINE CHONDR COMPLEX PO) Take 1 tablet by mouth 2 (two) times daily.    07/25/2016 at Unknown time  . metoprolol (LOPRESSOR) 100 MG tablet Take 100 mg by mouth 2 (two) times daily.   07/26/2016 at 0445  . Multiple Vitamins-Minerals (MULTIVITAMIN PO) Take 1 tablet by mouth daily.   07/19/16  . nitroGLYCERIN (NITROSTAT) 0.4 MG SL tablet Place 0.4 mg under the tongue every 5 (five) minutes as needed for chest pain.   2016  . Omega-3 Fatty Acids (FISH OIL PO) Take 1 tablet by mouth daily.   07/17/16    ALLERGIES:   Allergies  Allergen  Reactions  . No Known Allergies     REVIEW OF SYSTEMS:  A comprehensive review of systems was negative except for: Musculoskeletal: positive for arthralgias and bone pain   FAMILY HISTORY:   Family History  Problem Relation Age of Onset  . Heart failure Mother   . Lung cancer Father   . Heart attack Brother   . Diabetes Brother     SOCIAL HISTORY:   Social History  Substance Use Topics  . Smoking status: Former Smoker    Packs/day: 1.00    Years: 5.00    Types: Cigarettes  . Smokeless tobacco: Former Systems developer     Comment: "quit smoking in 1976"  . Alcohol use 0.0 oz/week     Comment: 03/14/2015 "Might have a drink a couple  times/month"     EXAMINATION:  Vital signs in last 24 hours: Weight:  [87.1 kg (192 lb)] 87.1 kg (192 lb) (08/21 0623)  Ht 5' 10.5" (1.791 m)   Wt 87.1 kg (192 lb)   BMI 27.16 kg/m   General Appearance:    Alert, cooperative, no distress, appears stated age  Head:    Normocephalic, without obvious abnormality, atraumatic  Eyes:    PERRL, conjunctiva/corneas clear, EOM's intact, fundi    benign, both eyes       Ears:    Normal TM's and external ear canals, both ears  Nose:   Nares normal, septum midline, mucosa normal, no drainage    or sinus tenderness  Throat:   Lips, mucosa, and tongue normal; teeth and gums normal  Neck:   Supple, symmetrical, trachea midline, no adenopathy;       thyroid:  No enlargement/tenderness/nodules; no carotid   bruit or JVD  Back:     Symmetric, no curvature, ROM normal, no CVA tenderness  Lungs:     Clear to auscultation bilaterally, respirations unlabored  Chest wall:    No tenderness or deformity  Heart:    Regular rate and rhythm, S1 and S2 normal, no murmur, rub   or gallop  Abdomen:     Soft, non-tender, bowel sounds active all four quadrants,    no masses, no organomegaly  Genitalia:    Normal male without lesion, discharge or tenderness  Rectal:    Normal tone, normal prostate, no masses or tenderness;   guaiac negative stool  Extremities:   Extremities normal, atraumatic, no cyanosis or edema  Pulses:   2+ and symmetric all extremities  Skin:   Skin color, texture, turgor normal, no rashes or lesions  Lymph nodes:   Cervical, supraclavicular, and axillary nodes normal  Neurologic:   CNII-XII intact. Normal strength, sensation and reflexes      throughout     Musculoskeletal:  ROM 0-120, Ligaments intact,  Imaging Review Plain radiographs demonstrate severe degenerative joint disease of the right knee. The overall alignment is neutral. The bone quality appears to be excellent for age and reported activity  level.  Assessment/Plan: Primary osteoarthritis, right knee   The patient history, physical examination and imaging studies are consistent with advanced degenerative joint disease of the right knee. The patient has failed conservative treatment.  The clearance notes were reviewed.  After discussion with the patient it was felt that Total Knee Replacement was indicated. The procedure,  risks, and benefits of total knee arthroplasty were presented and reviewed. The risks including but not limited to aseptic loosening, infection, blood clots, vascular injury, stiffness, patella tracking problems complications among others were discussed. The patient acknowledged  the explanation, agreed to proceed with the plan. Donia Ast 07/26/2016, 6:30 AM

## 2016-07-27 ENCOUNTER — Encounter (HOSPITAL_COMMUNITY): Payer: Self-pay | Admitting: Orthopedic Surgery

## 2016-07-27 LAB — BASIC METABOLIC PANEL
Anion gap: 9 (ref 5–15)
BUN: 10 mg/dL (ref 6–20)
CALCIUM: 9.1 mg/dL (ref 8.9–10.3)
CHLORIDE: 96 mmol/L — AB (ref 101–111)
CO2: 30 mmol/L (ref 22–32)
CREATININE: 1.08 mg/dL (ref 0.61–1.24)
GFR calc non Af Amer: >60 mL/min (ref >60)
Glucose, Bld: 122 mg/dL — ABNORMAL HIGH (ref 65–99)
Potassium: 3.8 mmol/L (ref 3.5–5.1)
Sodium: 135 mmol/L (ref 135–145)

## 2016-07-27 LAB — CBC
HEMATOCRIT: 41.5 % (ref 39.0–52.0)
Hemoglobin: 14 g/dL (ref 13.0–17.0)
MCH: 32.5 pg (ref 26.0–34.0)
MCHC: 33.7 g/dL (ref 30.0–36.0)
MCV: 96.3 fL (ref 78.0–100.0)
Platelets: 169 10*3/uL (ref 150–400)
RBC: 4.31 MIL/uL (ref 4.22–5.81)
RDW: 12.5 % (ref 11.5–15.5)
WBC: 11.8 10*3/uL — ABNORMAL HIGH (ref 4.0–10.5)

## 2016-07-27 NOTE — Op Note (Signed)
TOTAL KNEE REPLACEMENT OPERATIVE NOTE:  07/26/2016  12:49 PM  PATIENT:  Timothy Mendez  70 y.o. male  PRE-OPERATIVE DIAGNOSIS:  primary osteoarthritis right knee   POST-OPERATIVE DIAGNOSIS:  primary osteoarthritis right knee  PROCEDURE:  Procedure(s): RIGHT TOTAL KNEE ARTHROPLASTY  SURGEON:  Surgeon(s): Vickey Huger, MD  PHYSICIAN ASSISTANT: Carlyon Shadow, [facility]   ANESTHESIA:   spinal  DRAINS: Hemovac  SPECIMEN: None  COUNTS:  Correct  TOURNIQUET:   Total Tourniquet Time Documented: Thigh (Right) - 57 minutes Total: Thigh (Right) - 57 minutes   DICTATION:  Indication for procedure:    The patient is a 70 y.o. male who has failed conservative treatment for primary osteoarthritis right knee .  Informed consent was obtained prior to anesthesia. The risks versus benefits of the operation were explain and in a way the patient can, and did, understand.   On the implant demand matching protocol, this patient scored 10.  Therefore, this patient did" "did not receive a polyethylene insert with vitamin E which is a high demand implant.  Description of procedure:     The patient was taken to the operating room and placed under anesthesia.  The patient was positioned in the usual fashion taking care that all body parts were adequately padded and/or protected.  I foley catheter was not placed.  A tourniquet was applied and the leg prepped and draped in the usual sterile fashion.  The extremity was exsanguinated with the esmarch and tourniquet inflated to 350 mmHg.  Pre-operative range of motion was normal.  The knee was in 10 degree of significant varus.  A midline incision approximately 6-7 inches long was made with a #10 blade.  A new blade was used to make a parapatellar arthrotomy going 2-3 cm into the quadriceps tendon, over the patella, and alongside the medial aspect of the patellar tendon.  A synovectomy was then performed with the #10 blade and forceps. I then elevated the  deep MCL off the medial tibial metaphysis subperiosteally around to the semimembranosus attachment.    I everted the patella and used calipers to measure patellar thickness.  I used the reamer to ream down to appropriate thickness to recreate the native thickness.  I then removed excess bone with the rongeur and sagittal saw.  I used the appropriately sized template and drilled the three lug holes.  I then put the trial in place and measured the thickness with the calipers to ensure recreation of the native thickness.  The trial was then removed and the patella subluxed and the knee brought into flexion.  A homan retractor was place to retract and protect the patella and lateral structures.  A Z-retractor was place medially to protect the medial structures.  The extra-medullary alignment system was used to make cut the tibial articular surface perpendicular to the anamotic axis of the tibia and in 3 degrees of posterior slope.  The cut surface and alignment jig was removed.  I then used the intramedullary alignment guide to make a 6 valgus cut on the distal femur.  I then marked out the epicondylar axis on the distal femur.  The posterior condylar axis measured 3 degrees.  I then used the anterior referencing sizer and measured the femur to be a size 9.  The 4-In-1 cutting block was screwed into place in external rotation matching the posterior condylar angle, making our cuts perpendicular to the epicondylar axis.  Anterior, posterior and chamfer cuts were made with the sagittal saw.  The cutting  block and cut pieces were removed.  A lamina spreader was placed in 90 degrees of flexion.  The ACL, PCL, menisci, and posterior condylar osteophytes were removed.  A 14 mm spacer blocked was found to offer good flexion and extension gap balance after moderate in degree releasing.   The scoop retractor was then placed and the femoral finishing block was pinned in place.  The small sagittal saw was used as well as  the lug drill to finish the femur.  The block and cut surfaces were removed and the medullary canal hole filled with autograft bone from the cut pieces.  The tibia was delivered forward in deep flexion and external rotation.  A size F tray was selected and pinned into place centered on the medial 1/3 of the tibial tubercle.  The reamer and keel was used to prepare the tibia through the tray.    I then trialed with the size 9 femur, size F tibia, a 14 mm insert and the 35 patella.  I had excellent flexion/extension gap balance, excellent patella tracking.  Flexion was full and beyond 120 degrees; extension was zero.  These components were chosen and the staff opened them to me on the back table while the knee was lavaged copiously and the cement mixed.  The soft tissue was infiltrated with 60cc of exparel 1.3% through a 21 gauge needle.  I cemented in the components and removed all excess cement.  The polyethylene tibial component was snapped into place and the knee placed in extension while cement was hardening.  The capsule was infilltrated with 30cc of .25% Marcaine with epinephrine.  A hemovac was place in the joint exiting superolaterally.  A pain pump was place superomedially superficial to the arthrotomy.  Once the cement was hard, the tourniquet was let down.  Hemostasis was obtained.  The arthrotomy was closed with figure-8 #1 vicryl sutures.  The deep soft tissues were closed with #0 vicryls and the subcuticular layer closed with a running #2-0 vicryl.  The skin was reapproximated and closed with skin staples.  The wound was dressed with xeroform, 4 x4's, 2 ABD sponges, a single layer of webril and a TED stocking.   The patient was then awakened, extubated, and taken to the recovery room in stable condition.  BLOOD LOSS:  300cc DRAINS: 1 hemovac, 1 pain catheter COMPLICATIONS:  None.  PLAN OF CARE: Admit to inpatient   PATIENT DISPOSITION:  PACU - hemodynamically stable.   Delay start of  Pharmacological VTE agent (>24hrs) due to surgical blood loss or risk of bleeding:  not applicable  Please fax a copy of this op note to my office at [phone] (please only include page 1 and 2 of the Case Information op note)

## 2016-07-27 NOTE — Evaluation (Signed)
Occupational Therapy Evaluation and Discharge Patient Details Name: Timothy Mendez MRN: [id number] DOB: 1946-05-02 Today's Date: 07/27/2016    History of Present Illness TREYVAUGHN DERUYTER is a 70 yo male admitted s/p R TKA. PMH includes prostate cancer, OA, HTN, and CAD.   Clinical Impression   Pt moving better POD1. Educated pt and wife in compensatory strategies for ADL. Pt is able to reach his R foot in sitting with increased effort. Pt able to stand to perform oral care with min guard assist. Educated in safe footwear and transporting items safely with walker. Pt has 24 hour care of his supportive wife. No further OT needs.    Follow Up Recommendations  No OT follow up    Equipment Recommendations  None recommended by OT    Recommendations for Other Services       Precautions / Restrictions Precautions Precautions: Fall;Knee Restrictions Weight Bearing Restrictions: Yes RLE Weight Bearing: Weight bearing as tolerated      Mobility Bed Mobility               General bed mobility comments: pt seated at EOB upon arrival  Transfers Overall transfer level: Needs assistance Equipment used: Rolling walker (2 wheeled) Transfers: Sit to/from Stand Sit to Stand: Min guard;Min assist         General transfer comment: verbal cues for hand placement and technique, min assist to rise from bed, min guard from recliner     Balance     Sitting balance-Leahy Scale: Good       Standing balance-Leahy Scale: Fair Standing balance comment: able to release walker in standing for grooming                            ADL Overall ADL's : Needs assistance/impaired Eating/Feeding: Independent;Sitting   Grooming: Oral care;Standing;Min guard   Upper Body Bathing: Set up;Sitting   Lower Body Bathing: Minimal assistance;Sit to/from stand Lower Body Bathing Details (indicate cue type and reason): recommended long handled bath sponge Upper Body Dressing : Set  up;Sitting   Lower Body Dressing: Minimal assistance;Sit to/from stand Lower Body Dressing Details (indicate cue type and reason): can reach his foot in sitting, educated pt in compensatory strategies   Toilet Transfer Details (indicate cue type and reason): instructed in positioning of walker so pt may stand to urinate       Tub/Shower Transfer Details (indicate cue type and reason): verbally instructed in technique for shower transfer Functional mobility during ADLs: Min guard;Rolling walker;Cueing for sequencing       Vision     Perception     Praxis      Pertinent Vitals/Pain Pain Assessment: 0-10 Pain Score: 4  Pain Location: R knee Pain Descriptors / Indicators: Grimacing;Guarding Pain Intervention(s): Repositioned;Ice applied;Patient requesting pain meds-RN notified;Monitored during session     Hand Dominance Right   Extremity/Trunk Assessment Upper Extremity Assessment Upper Extremity Assessment: Overall WFL for tasks assessed   Lower Extremity Assessment Lower Extremity Assessment: Defer to PT evaluation       Communication Communication Communication: No difficulties   Cognition Arousal/Alertness: Awake/alert Behavior During Therapy: WFL for tasks assessed/performed Overall Cognitive Status: Within Functional Limits for tasks assessed                     General Comments       Exercises       Shoulder Instructions      Home Living Family/patient  expects to be discharged to:: Private residence Living Arrangements: Spouse/significant other Available Help at Discharge: Family;Available 24 hours/day Type of Home: House Home Access: Level entry     Home Layout: Able to live on main level with bedroom/bathroom     Bathroom Shower/Tub: Walk-in shower   Bathroom Toilet: Handicapped height     Home Equipment: Environmental consultant - 2 wheels;Bedside commode;Shower seat - built in;Tub bench;Shower seat;Grab bars - toilet;Grab bars - tub/shower;Hand  held shower head          Prior Functioning/Environment Level of Independence: Independent        Comments: Pt independent with ADLs and driving. Pt is an Chief Financial Officer.    OT Diagnosis: Generalized weakness;Acute pain   OT Problem List:     OT Treatment/Interventions:      OT Goals(Current goals can be found in the care plan section) Acute Rehab OT Goals Patient Stated Goal: to get stronger and be independent  OT Frequency:     Barriers to D/C:            Co-evaluation PT/OT/SLP Co-Evaluation/Treatment: Yes Reason for Co-Treatment: For patient/therapist safety (pt requiring max assist upon eval)   OT goals addressed during session: ADL's and self-care      End of Session Equipment Utilized During Treatment: Gait belt;Rolling walker Nurse Communication: Mobility status  Activity Tolerance: Patient tolerated treatment well Patient left: in chair;with call bell/phone within reach;with family/visitor present;with nursing/sitter in room   Time: 1022-1102 OT Time Calculation (min): 40 min Charges:  OT General Charges $OT Visit: 1 Procedure OT Evaluation $OT Eval Low Complexity: 1 Procedure G-Codes:    Malka So 07/27/2016, 11:33 AM  [phone]

## 2016-07-27 NOTE — Progress Notes (Signed)
Reviewed discharge information/medications with patient and patient's wife.  Answered all of their questions.

## 2016-07-27 NOTE — Progress Notes (Signed)
Orthopedic Tech Progress Note Patient Details:  Timothy Mendez 1946-07-29 [id number]  Patient ID: Timothy Mendez, male   DOB: 07/07/1946, 70 y.o.   MRN: [id number] Applied cpm 0-60  Timothy Mendez 07/27/2016, 5:45 AM

## 2016-07-27 NOTE — Progress Notes (Signed)
Physical Therapy Treatment Patient Details Name: Timothy Mendez MRN: [id number] DOB: 1946/03/30 Today's Date: 07/27/2016    History of Present Illness Timothy Mendez is a 70 yo male admitted s/p R TKA. PMH includes prostate cancer, OA, HTN, and CAD.    PT Comments    Pt performed increased training with stairs in prep for d/c home.  Pt awaiting d/c orders with hope to d/c home today.  Pt tolerated treatment well and nurse informed that patient is ready for d/c.    Follow Up Recommendations  Home health PT;Supervision/Assistance - 24 hour     Equipment Recommendations  None recommended by PT    Recommendations for Other Services       Precautions / Restrictions Precautions Precautions: Fall;Knee Precaution Comments: No pillow behind knee. Maintain bone foam for knee extension. Restrictions Weight Bearing Restrictions: Yes RLE Weight Bearing: Weight bearing as tolerated    Mobility  Bed Mobility Overal bed mobility: Needs Assistance Bed Mobility: Sit to Supine       Sit to supine: Supervision   General bed mobility comments: No assist needed cues for technique.    Transfers Overall transfer level: Needs assistance Equipment used: Rolling walker (2 wheeled) Transfers: Sit to/from Stand Sit to Stand: Min guard Stand pivot transfers: Mod assist       General transfer comment: Cues for hand placement, forward weight shifting, forward advancement of RLE, and R hip extension during transition.  Pt slow to transition and guarded secondary to pain.    Ambulation/Gait Ambulation/Gait assistance: Min assist Ambulation Distance (Feet): 80 Feet Assistive device: Rolling walker (2 wheeled) Gait Pattern/deviations: Step-through pattern;Trunk flexed;Antalgic;Decreased stride length   Gait velocity interpretation: Below normal speed for age/gender General Gait Details: Cues for sequencing, upper trunk control and RW positioning/placement.  Pt required cues for R heel  strike and knee extension in R stance phase.    Stairs Stairs: Yes Stairs assistance: Min guard Stair Management: With walker;One rail Right;Step to pattern;Forwards;Backwards;With cane Number of Stairs: 9 (Pt performed curb training x 3 reps, and x6 stairs with L rail and cane.  ) General stair comments: Pt required cues for sequecing and RW placement during curb training.  Pt performed x1 forward ascending and x2 backwards ascending.  Pt perform x6 stairs with R rail and cane on left to simulate 2nd level home access.  Pt required cues for sequencing and cane placement.  Wife present to observe session.    Wheelchair Mobility    Modified Rankin (Stroke Patients Only)       Balance Overall balance assessment: Needs assistance   Sitting balance-Leahy Scale: Good       Standing balance-Leahy Scale: Fair                      Cognition Arousal/Alertness: Awake/alert Behavior During Therapy: WFL for tasks assessed/performed Overall Cognitive Status: Within Functional Limits for tasks assessed                      Exercises    General Comments        Pertinent Vitals/Pain Pain Assessment: 0-10 Pain Score: 8  Pain Location: R knee Pain Descriptors / Indicators: Guarding;Grimacing Pain Intervention(s): Monitored during session;Repositioned    Home Living                      Prior Function            PT Goals (current goals  can now be found in the care plan section) Acute Rehab PT Goals Patient Stated Goal: to get stronger and be independent Potential to Achieve Goals: Good Progress towards PT goals: Progressing toward goals    Frequency  7X/week    PT Plan Current plan remains appropriate    Co-evaluation PT/OT/SLP Co-Evaluation/Treatment: Yes Reason for Co-Treatment: For patient/therapist safety PT goals addressed during session: Mobility/safety with mobility;Strengthening/ROM OT goals addressed during session: ADL's and  self-care     End of Session Equipment Utilized During Treatment: Gait belt Activity Tolerance: Patient tolerated treatment well Patient left: in chair;with call bell/phone within reach;with family/visitor present     Time: 1510-1533 PT Time Calculation (min) (ACUTE ONLY): 23 min  Charges:  $Gait Training: 23-37 mins                     G Codes:      Cristela Blue August 10, 2016, 4:10 PM  Governor Rooks, PTA pager [phone]

## 2016-07-27 NOTE — Discharge Summary (Signed)
SPORTS MEDICINE & JOINT REPLACEMENT   Lara Mulch, MD   Carlyon Shadow, PA-C [place], [place], [place]  [address]                             [phone]  PATIENT ID: Timothy Mendez        MRN:  [id number]          DOB/AGE: [birth year] / 70 y.o.    DISCHARGE SUMMARY  ADMISSION DATE:    07/26/2016 DISCHARGE DATE:   07/27/2016   ADMISSION DIAGNOSIS: primary osteoarthritis right knee     DISCHARGE DIAGNOSIS:  primary osteoarthritis right knee     ADDITIONAL DIAGNOSIS: Active Problems:   S/P total knee replacement  Past Medical History:  Diagnosis Date  . Anginal pain ([place])   . Coronary artery disease   . GERD (gastroesophageal reflux disease)   . Hypertension   . Osteoarthritis    knees  . Prostate cancer [facility])     PROCEDURE: Procedure(s): RIGHT TOTAL KNEE ARTHROPLASTY on 07/26/2016  CONSULTS:    HISTORY:  See H&P in chart  HOSPITAL COURSE:  Timothy Mendez is a 70 y.o. admitted on 07/26/2016 and found to have a diagnosis of primary osteoarthritis right knee .  After appropriate laboratory studies were obtained  they were taken to the operating room on 07/26/2016 and underwent Procedure(s): RIGHT TOTAL KNEE ARTHROPLASTY.   They were given perioperative antibiotics:  Anti-infectives    Start     Dose/Rate Route Frequency Ordered Stop   07/26/16 1400  ceFAZolin (ANCEF) IVPB 1 g/50 mL premix     1 g 100 mL/hr over 30 Minutes Intravenous Every 6 hours 07/26/16 1125 07/26/16 2026   07/26/16 0700  ceFAZolin (ANCEF) IVPB 2g/100 mL premix     2 g 200 mL/hr over 30 Minutes Intravenous To ShortStay Surgical 07/25/16 0826 07/26/16 0750    .  Patient given tranexamic acid IV or topical and exparel intra-operatively.  Tolerated the procedure well.    POD# 1: Vital signs were stable.  Patient denied Chest pain, shortness of breath, or calf pain.  Patient was started on Lovenox 30 mg subcutaneously twice daily at 8am.  Consults to PT, OT, and care management were  made.  The patient was weight bearing as tolerated.  CPM was placed on the operative leg 0-90 degrees for 6-8 hours a day. When out of the CPM, patient was placed in the foam block to achieve full extension. Incentive spirometry was taught.  Dressing was changed.       POD #2, Continued  PT for ambulation and exercise program.  IV saline locked.  O2 discontinued.    The remainder of the hospital course was dedicated to ambulation and strengthening.   The patient was discharged on 1 Day Post-Op in  Good condition.  Blood products given:none  DIAGNOSTIC STUDIES: Recent vital signs: Patient Vitals for the past 24 hrs:  BP Temp Temp src Pulse Resp SpO2  07/27/16 0828 (!) 160/73 - - 65 - -  07/27/16 0445 (!) 163/66 99.5 F (37.5 C) Oral 66 18 95 %  07/27/16 0000 (!) 152/66 (!) 100.7 F (38.2 C) Oral 67 16 94 %  07/26/16 2155 - 100.2 F (37.9 C) Oral - - -  07/26/16 1931 (!) 150/66 97.7 F (36.5 C) Oral 63 16 92 %       Recent laboratory studies:  Recent Labs  07/27/16 0517  WBC 11.8*  HGB 14.0  HCT 41.5  PLT 169    Recent Labs  07/27/16 0517  NA 135  K 3.8  CL 96*  CO2 30  BUN 10  CREATININE 1.08  GLUCOSE 122*  CALCIUM 9.1   Lab Results  Component Value Date   INR 1.03 07/13/2016   INR 0.96 03/13/2015     Recent Radiographic Studies :  Dg Chest 2 View  Result Date: 07/13/2016 CLINICAL DATA:  70 year old male with a history of knee replacement EXAM: CHEST  2 VIEW COMPARISON:  03/13/2015 FINDINGS: Cardiomediastinal silhouette unchanged in size and contour. No pneumothorax or pleural effusion.  No confluent airspace disease. Mild coarsening of interstitial markings.  No displaced fracture. Coronary stent in place. IMPRESSION: No radiographic evidence of acute cardiopulmonary disease. Coronary stenting compatible with known coronary artery disease. Signed, Dulcy Fanny. Earleen Newport, DO [facility] Radiology Electronically Signed    By: Corrie Mckusick D.O.   On: 07/13/2016 13:30    DISCHARGE INSTRUCTIONS: Discharge Instructions    CPM    Complete by:  As directed   Continuous passive motion machine (CPM):      Use the CPM from 0 to 90 for 4-6 hours per day.      You may increase by 10 per day.  You may break it up into 2 or 3 sessions per day.      Use CPM for 2 weeks or until you are told to stop.   Call MD / Call 911    Complete by:  As directed   If you experience chest pain or shortness of breath, CALL 911 and be transported to the hospital emergency room.  If you develope a fever above 101 F, pus (white drainage) or increased drainage or redness at the wound, or calf pain, call your surgeon's office.   Constipation Prevention    Complete by:  As directed   Drink plenty of fluids.  Prune juice may be helpful.  You may use a stool softener, such as Colace (over the counter) 100 mg twice a day.  Use MiraLax (over the counter) for constipation as needed.   Diet - low sodium heart healthy    Complete by:  As directed   Discharge instructions    Complete by:  As directed   INSTRUCTIONS AFTER JOINT REPLACEMENT   Remove items at home which could result in a fall. This includes throw rugs or furniture in walking pathways ICE to the affected joint every three hours while awake for 30 minutes at a time, for at least the first 3-5 days, and then as needed for pain and swelling.  Continue to use ice for pain and swelling. You may notice swelling that will progress down to the foot and ankle.  This is normal after surgery.  Elevate your leg when you are not up walking on it.   Continue to use the breathing machine you got in the hospital (incentive spirometer) which will help keep your temperature down.  It is common for your temperature to cycle up and down following surgery, especially at night when you are not up moving around and exerting yourself.  The breathing machine keeps your lungs expanded and your temperature  down.   DIET:  As you were doing prior to hospitalization, we recommend a well-balanced diet.  DRESSING / WOUND CARE / SHOWERING  Keep the surgical dressing until follow up.  The dressing is water proof, so you can shower  without any extra covering.  IF THE DRESSING FALLS OFF or the wound gets wet inside, change the dressing with sterile gauze.  Please use good hand washing techniques before changing the dressing.  Do not use any lotions or creams on the incision until instructed by your surgeon.    ACTIVITY  Increase activity slowly as tolerated, but follow the weight bearing instructions below.   No driving for 6 weeks or until further direction given by your physician.  You cannot drive while taking narcotics.  No lifting or carrying greater than 10 lbs. until further directed by your surgeon. Avoid periods of inactivity such as sitting longer than an hour when not asleep. This helps prevent blood clots.  You may return to work once you are authorized by your doctor.     WEIGHT BEARING   Weight bearing as tolerated with assist device (walker, cane, etc) as directed, use it as long as suggested by your surgeon or therapist, typically at least 4-6 weeks.   EXERCISES  Results after joint replacement surgery are often greatly improved when you follow the exercise, range of motion and muscle strengthening exercises prescribed by your doctor. Safety measures are also important to protect the joint from further injury. Any time any of these exercises cause you to have increased pain or swelling, decrease what you are doing until you are comfortable again and then slowly increase them. If you have problems or questions, call your caregiver or physical therapist for advice.   Rehabilitation is important following a joint replacement. After just a few days of immobilization, the muscles of the leg can become weakened and shrink (atrophy).  These exercises are designed to build up the tone and  strength of the thigh and leg muscles and to improve motion. Often times heat used for twenty to thirty minutes before working out will loosen up your tissues and help with improving the range of motion but do not use heat for the first two weeks following surgery (sometimes heat can increase post-operative swelling).   These exercises can be done on a training (exercise) mat, on the floor, on a table or on a bed. Use whatever works the best and is most comfortable for you.    Use music or television while you are exercising so that the exercises are a pleasant break in your day. This will make your life better with the exercises acting as a break in your routine that you can look forward to.   Perform all exercises about fifteen times, three times per day or as directed.  You should exercise both the operative leg and the other leg as well.   Exercises include:   Quad Sets - Tighten up the muscle on the front of the thigh (Quad) and hold for 5-10 seconds.   Straight Leg Raises - With your knee straight (if you were given a brace, keep it on), lift the leg to 60 degrees, hold for 3 seconds, and slowly lower the leg.  Perform this exercise against resistance later as your leg gets stronger.  Leg Slides: Lying on your back, slowly slide your foot toward your buttocks, bending your knee up off the floor (only go as far as is comfortable). Then slowly slide your foot back down until your leg is flat on the floor again.  Angel Wings: Lying on your back spread your legs to the side as far apart as you can without causing discomfort.  Hamstring Strength:  Lying on your back, push  your heel against the floor with your leg straight by tightening up the muscles of your buttocks.  Repeat, but this time bend your knee to a comfortable angle, and push your heel against the floor.  You may put a pillow under the heel to make it more comfortable if necessary.   A rehabilitation program following joint replacement  surgery can speed recovery and prevent re-injury in the future due to weakened muscles. Contact your doctor or a physical therapist for more information on knee rehabilitation.    CONSTIPATION  Constipation is defined medically as fewer than three stools per week and severe constipation as less than one stool per week.  Even if you have a regular bowel pattern at home, your normal regimen is likely to be disrupted due to multiple reasons following surgery.  Combination of anesthesia, postoperative narcotics, change in appetite and fluid intake all can affect your bowels.   YOU MUST use at least one of the following options; they are listed in order of increasing strength to get the job done.  They are all available over the counter, and you may need to use some, POSSIBLY even all of these options:    Drink plenty of fluids (prune juice may be helpful) and high fiber foods Colace 100 mg by mouth twice a day  Senokot for constipation as directed and as needed Dulcolax (bisacodyl), take with full glass of water  Miralax (polyethylene glycol) once or twice a day as needed.  If you have tried all these things and are unable to have a bowel movement in the first 3-4 days after surgery call either your surgeon or your primary doctor.    If you experience loose stools or diarrhea, hold the medications until you stool forms back up.  If your symptoms do not get better within 1 week or if they get worse, check with your doctor.  If you experience "the worst abdominal pain ever" or develop nausea or vomiting, please contact the office immediately for further recommendations for treatment.   ITCHING:  If you experience itching with your medications, try taking only a single pain pill, or even half a pain pill at a time.  You can also use Benadryl over the counter for itching or also to help with sleep.   TED HOSE STOCKINGS:  Use stockings on both legs until for at least 2 weeks or as directed by physician  office. They may be removed at night for sleeping.  MEDICATIONS:  See your medication summary on the "After Visit Summary" that nursing will review with you.  You may have some home medications which will be placed on hold until you complete the course of blood thinner medication.  It is important for you to complete the blood thinner medication as prescribed.  PRECAUTIONS:  If you experience chest pain or shortness of breath - call 911 immediately for transfer to the hospital emergency department.   If you develop a fever greater that 101 F, purulent drainage from wound, increased redness or drainage from wound, foul odor from the wound/dressing, or calf pain - CONTACT YOUR SURGEON.                                                   FOLLOW-UP APPOINTMENTS:  If you do not already have a post-op appointment, please call the office  for an appointment to be seen by your surgeon.  Guidelines for how soon to be seen are listed in your "After Visit Summary", but are typically between 1-4 weeks after surgery.  OTHER INSTRUCTIONS:   Knee Replacement:  Do not place pillow under knee, focus on keeping the knee straight while resting. CPM instructions: 0-90 degrees, 2 hours in the morning, 2 hours in the afternoon, and 2 hours in the evening. Place foam block, curve side up under heel at all times except when in CPM or when walking.  DO NOT modify, tear, cut, or change the foam block in any way.  MAKE SURE YOU:  Understand these instructions.  Get help right away if you are not doing well or get worse.    Thank you for letting us be a part of your medical care team.  It is a privilege we respect greatly.  We hope these instructions will help you stay on track for a fast and full recovery!   Increase activity slowly as tolerated    Complete by:  As directed      DISCHARGE MEDICATIONS:     Medication List    STOP taking these medications   celecoxib 200 MG capsule Commonly known as:  CELEBREX      TAKE these medications   acetaminophen 500 MG tablet Commonly known as:  TYLENOL Take 500 mg by mouth every 6 (six) hours as needed.   aspirin EC 81 MG tablet Take 81 mg by mouth daily.   atorvastatin 40 MG tablet Commonly known as:  LIPITOR Take 1 tablet (40 mg total) by mouth daily at 6 PM. What changed:  how much to take   clopidogrel 75 MG tablet Commonly known as:  PLAVIX Take 1 tablet (75 mg total) by mouth daily with breakfast.   FISH OIL PO Take 1 tablet by mouth daily.   GLUCOSAMINE CHONDR COMPLEX PO Take 1 tablet by mouth 2 (two) times daily.   metoprolol 100 MG tablet Commonly known as:  LOPRESSOR Take 100 mg by mouth 2 (two) times daily.   MULTIVITAMIN PO Take 1 tablet by mouth daily.   nitroGLYCERIN 0.4 MG SL tablet Commonly known as:  NITROSTAT Place 0.4 mg under the tongue every 5 (five) minutes as needed for chest pain.   VITAMIN D PO Take 1 tablet by mouth daily.       FOLLOW UP VISIT:   Follow-up Information    [facility] .   Why:  Someone from [place] [facility] [employer]), will contact you to arrange start date and time for therapy. Contact information: [place] [address] [phone]           DISPOSITION: HOME VS. SNF  CONDITION:  Good   Donia Ast 07/27/2016, 4:49 PM

## 2016-07-27 NOTE — Progress Notes (Signed)
SPORTS MEDICINE AND JOINT REPLACEMENT  Lara Mulch, MD    Carlyon Shadow, PA-C [place], [place], [place]  [address]                             [phone]   PROGRESS NOTE  Subjective:  negative for Chest Pain  negative for Shortness of Breath  negative for Nausea/Vomiting   negative for Calf Pain  negative for Bowel Movement   Tolerating Diet: yes         Patient reports pain as 6 on 0-10 scale.    Objective: Vital signs in last 24 hours:   Patient Vitals for the past 24 hrs:  BP Temp Temp src Pulse Resp SpO2  07/27/16 0445 (!) 163/66 99.5 F (37.5 C) Oral 66 18 95 %  07/27/16 0000 (!) 152/66 (!) 100.7 F (38.2 C) Oral 67 16 94 %  07/26/16 2155 - 100.2 F (37.9 C) Oral - - -  07/26/16 1931 (!) 150/66 97.7 F (36.5 C) Oral 63 16 92 %  07/26/16 1300 (!) 163/91 97.7 F (36.5 C) Oral (!) 53 16 100 %  07/26/16 1054 - - - (!) 48 11 100 %  07/26/16 1045 - 97.7 F (36.5 C) - (!) 50 16 99 %  07/26/16 1044 (!) 146/79 - - (!) 46 12 98 %  07/26/16 1030 - - - (!) 48 13 96 %  07/26/16 1029 (!) 134/92 - - (!) 46 11 99 %  07/26/16 1015 - - - (!) 49 17 99 %  07/26/16 1014 (!) 142/75 - - (!) 47 17 99 %  07/26/16 1000 - - - (!) 48 15 99 %  07/26/16 0959 131/73 - - (!) 51 14 99 %  07/26/16 0945 113/69 97.2 F (36.2 C) - (!) 50 16 98 %    @flow {1959:LAST@   Intake/Output from previous day:   08/21 0701 - 08/22 0700 In: 2557.5 [P.O.:120; I.V.:2437.5] Out: 2470 [Urine:2450]   Intake/Output this shift:   No intake/output data recorded.   Intake/Output      08/21 0701 - 08/22 0700 08/22 0701 - 08/23 0700   P.O. 120    I.V. (mL/kg) 2437.5 (28)    Total Intake(mL/kg) 2557.5 (29.4)    Urine (mL/kg/hr) 2450 (1.2)    Blood 20 (0)    Total Output 2470     Net +87.5             LABORATORY DATA:  Recent Labs  07/27/16 0517  WBC 11.8*  HGB 14.0  HCT 41.5  PLT 169    Recent Labs  07/27/16 0517  NA 135  K 3.8  CL 96*  CO2 30  BUN 10  CREATININE 1.08   GLUCOSE 122*  CALCIUM 9.1   Lab Results  Component Value Date   INR 1.03 07/13/2016   INR 0.96 03/13/2015    Examination:  General appearance: alert, cooperative and no distress Extremities: extremities normal, atraumatic, no cyanosis or edema  Wound Exam: clean, dry, intact   Drainage:  None: wound tissue dry  Motor Exam: Quadriceps and Hamstrings Intact  Sensory Exam: Superficial Peroneal, Deep Peroneal and Tibial normal   Assessment:    1 Day Post-Op  Procedure(s) (LRB): RIGHT TOTAL KNEE ARTHROPLASTY (Right)  ADDITIONAL DIAGNOSIS:  Active Problems:   S/P total knee replacement  Acute Blood Loss Anemia   Plan: Physical Therapy as ordered Weight Bearing as Tolerated (WBAT)  DVT  Prophylaxis:  Aspirin  DISCHARGE PLAN: Home  DISCHARGE NEEDS: HHPT   Patient doing great, expected D/C today         Donia Ast 07/27/2016, 7:09 AM

## 2016-07-27 NOTE — Progress Notes (Signed)
Physical Therapy Treatment Patient Details Name: Timothy Mendez MRN: [id number] DOB: May 07, 1946 Today's Date: 07/27/2016    History of Present Illness Timothy Mendez is a 70 yo male admitted s/p R TKA. PMH includes prostate cancer, OA, HTN, and CAD.    PT Comments    Pt progressed to gait training a additonal therapeutic exercise in supine position.  Will return in pm to address stair training and remainder of HEP in prep for d/c home.    Follow Up Recommendations  Home health PT;Supervision/Assistance - 24 hour     Equipment Recommendations  None recommended by PT    Recommendations for Other Services       Precautions / Restrictions Precautions Precautions: Fall;Knee Precaution Comments: No pillow behind knee. Maintain bone foam for knee extension. Restrictions Weight Bearing Restrictions: Yes RLE Weight Bearing: Weight bearing as tolerated    Mobility  Bed Mobility               General bed mobility comments: pt seated at EOB upon arrival  Transfers Overall transfer level: Needs assistance Equipment used: Rolling walker (2 wheeled) Transfers: Sit to/from Stand Sit to Stand: Min guard;Min assist (Initially min +2 due to fear of standing.  ) Stand pivot transfers: Mod assist       General transfer comment: verbal cues for hand placement and technique, min assist to rise from bed, min guard from recliner   Ambulation/Gait Ambulation/Gait assistance: Min assist Ambulation Distance (Feet): 100 Feet Assistive device: Rolling walker (2 wheeled) Gait Pattern/deviations: Step-through pattern;Trunk flexed;Step-to pattern;Antalgic;Decreased stride length   Gait velocity interpretation: <1.8 ft/sec, indicative of risk for recurrent falls General Gait Details: Cues for sequencing, upper trunk control and RW positioning/placement.  Pt required cues for R heel strike and knee extension in R stance phase.    Stairs Stairs:  (will try this pm.  )           Wheelchair Mobility    Modified Rankin (Stroke Patients Only)       Balance Overall balance assessment: Needs assistance   Sitting balance-Leahy Scale: Good       Standing balance-Leahy Scale: Fair Standing balance comment: able to release walker in standing for grooming                    Cognition Arousal/Alertness: Awake/alert Behavior During Therapy: WFL for tasks assessed/performed Overall Cognitive Status: Within Functional Limits for tasks assessed                      Exercises Total Joint Exercises Ankle Circles/Pumps: AROM;Right;10 reps;Supine Quad Sets: AROM;Right;10 reps;Supine Heel Slides: AROM;Right;10 reps;AAROM;Supine Hip ABduction/ADduction: AAROM;Right;10 reps;Supine Straight Leg Raises: AAROM;Right;10 reps;Supine Goniometric ROM: 83 degrees flexion in R knee.      General Comments        Pertinent Vitals/Pain Pain Assessment: 0-10 Pain Score: 4  Pain Location: R knee Pain Descriptors / Indicators: Guarding;Grimacing Pain Intervention(s): Monitored during session;Repositioned;Ice applied    Home Living Family/patient expects to be discharged to:: Private residence Living Arrangements: Spouse/significant other Available Help at Discharge: Family;Available 24 hours/day Type of Home: House Home Access: Level entry   Home Layout: Able to live on main level with bedroom/bathroom Home Equipment: Walker - 2 wheels;Bedside commode;Shower seat - built in;Tub bench;Shower seat;Grab bars - toilet;Grab bars - tub/shower;Hand held shower head      Prior Function Level of Independence: Independent      Comments: Pt independent with ADLs and driving. Pt  is an Chief Financial Officer.   PT Goals (current goals can now be found in the care plan section) Acute Rehab PT Goals Patient Stated Goal: to get stronger and be independent Potential to Achieve Goals: Good Progress towards PT goals: Progressing toward goals    Frequency  7X/week    PT  Plan Current plan remains appropriate    Co-evaluation PT/OT/SLP Co-Evaluation/Treatment: Yes Reason for Co-Treatment: For patient/therapist safety PT goals addressed during session: Mobility/safety with mobility;Strengthening/ROM OT goals addressed during session: ADL's and self-care     End of Session Equipment Utilized During Treatment: Gait belt Activity Tolerance: Patient tolerated treatment well Patient left: in chair;with call bell/phone within reach;with family/visitor present     Time: [id number] PT Time Calculation (min) (ACUTE ONLY): 40 min  Charges:  $Gait Training: 8-22 mins $Therapeutic Exercise: 8-22 mins                    G Codes:      Cristela Blue 2016-08-08, 1:20 PM  Governor Rooks, PTA pager [phone]

## 2016-07-27 NOTE — Care Management Note (Signed)
Case Management Note  Patient Details  Name: Timothy Mendez MRN: [id number] Date of Birth: Sep 15, 1946  Subjective/Objective:    70 yr old gentleman s/p right total knee arthroplasty.                Action/Plan: Case manager spoke with patient's wife (he was in restroom), concerning home health and DME needs. Patient was preoperatively setup with [facility], no changes. She states Rolling walker, 3in1 and CPM have been delivered to their home. Patient will have family support at discharge.   Expected Discharge Date:    07/27/16              Expected Discharge Plan:  [place]  In-House Referral:     Discharge planning Services  CM Consult  Post Acute Care Choice:  Home Health Choice offered to:  Patient, Spouse  DME Arranged:  N/A DME Agency:  [facility]  HH Arranged:  PT [place] Agency:  [place] (now [facility])  Status of Service:     If discussed at [place] of Stay Meetings, dates discussed:    Additional Comments:  Ninfa Meeker, RN 07/27/2016, 3:36 PM

## 2016-07-28 DIAGNOSIS — Z87891 Personal history of nicotine dependence: Secondary | ICD-10-CM | POA: Diagnosis not present

## 2016-07-28 DIAGNOSIS — Z8546 Personal history of malignant neoplasm of prostate: Secondary | ICD-10-CM | POA: Diagnosis not present

## 2016-07-28 DIAGNOSIS — I25119 Atherosclerotic heart disease of native coronary artery with unspecified angina pectoris: Secondary | ICD-10-CM | POA: Diagnosis not present

## 2016-07-28 DIAGNOSIS — I10 Essential (primary) hypertension: Secondary | ICD-10-CM | POA: Diagnosis not present

## 2016-07-28 DIAGNOSIS — Z471 Aftercare following joint replacement surgery: Secondary | ICD-10-CM | POA: Diagnosis not present

## 2016-07-28 DIAGNOSIS — Z96651 Presence of right artificial knee joint: Secondary | ICD-10-CM | POA: Diagnosis not present

## 2016-07-29 DIAGNOSIS — Z8546 Personal history of malignant neoplasm of prostate: Secondary | ICD-10-CM | POA: Diagnosis not present

## 2016-07-29 DIAGNOSIS — Z471 Aftercare following joint replacement surgery: Secondary | ICD-10-CM | POA: Diagnosis not present

## 2016-07-29 DIAGNOSIS — I25119 Atherosclerotic heart disease of native coronary artery with unspecified angina pectoris: Secondary | ICD-10-CM | POA: Diagnosis not present

## 2016-07-29 DIAGNOSIS — Z87891 Personal history of nicotine dependence: Secondary | ICD-10-CM | POA: Diagnosis not present

## 2016-07-29 DIAGNOSIS — Z96651 Presence of right artificial knee joint: Secondary | ICD-10-CM | POA: Diagnosis not present

## 2016-07-29 DIAGNOSIS — I10 Essential (primary) hypertension: Secondary | ICD-10-CM | POA: Diagnosis not present

## 2016-07-30 DIAGNOSIS — I25119 Atherosclerotic heart disease of native coronary artery with unspecified angina pectoris: Secondary | ICD-10-CM | POA: Diagnosis not present

## 2016-07-30 DIAGNOSIS — Z8546 Personal history of malignant neoplasm of prostate: Secondary | ICD-10-CM | POA: Diagnosis not present

## 2016-07-30 DIAGNOSIS — I10 Essential (primary) hypertension: Secondary | ICD-10-CM | POA: Diagnosis not present

## 2016-07-30 DIAGNOSIS — Z471 Aftercare following joint replacement surgery: Secondary | ICD-10-CM | POA: Diagnosis not present

## 2016-07-30 DIAGNOSIS — Z87891 Personal history of nicotine dependence: Secondary | ICD-10-CM | POA: Diagnosis not present

## 2016-07-30 DIAGNOSIS — Z96651 Presence of right artificial knee joint: Secondary | ICD-10-CM | POA: Diagnosis not present

## 2016-07-31 DIAGNOSIS — I25119 Atherosclerotic heart disease of native coronary artery with unspecified angina pectoris: Secondary | ICD-10-CM | POA: Diagnosis not present

## 2016-07-31 DIAGNOSIS — I10 Essential (primary) hypertension: Secondary | ICD-10-CM | POA: Diagnosis not present

## 2016-07-31 DIAGNOSIS — Z96651 Presence of right artificial knee joint: Secondary | ICD-10-CM | POA: Diagnosis not present

## 2016-07-31 DIAGNOSIS — Z8546 Personal history of malignant neoplasm of prostate: Secondary | ICD-10-CM | POA: Diagnosis not present

## 2016-07-31 DIAGNOSIS — Z471 Aftercare following joint replacement surgery: Secondary | ICD-10-CM | POA: Diagnosis not present

## 2016-07-31 DIAGNOSIS — Z87891 Personal history of nicotine dependence: Secondary | ICD-10-CM | POA: Diagnosis not present

## 2016-08-02 DIAGNOSIS — Z87891 Personal history of nicotine dependence: Secondary | ICD-10-CM | POA: Diagnosis not present

## 2016-08-02 DIAGNOSIS — Z471 Aftercare following joint replacement surgery: Secondary | ICD-10-CM | POA: Diagnosis not present

## 2016-08-02 DIAGNOSIS — Z8546 Personal history of malignant neoplasm of prostate: Secondary | ICD-10-CM | POA: Diagnosis not present

## 2016-08-02 DIAGNOSIS — I10 Essential (primary) hypertension: Secondary | ICD-10-CM | POA: Diagnosis not present

## 2016-08-02 DIAGNOSIS — Z96651 Presence of right artificial knee joint: Secondary | ICD-10-CM | POA: Diagnosis not present

## 2016-08-02 DIAGNOSIS — I25119 Atherosclerotic heart disease of native coronary artery with unspecified angina pectoris: Secondary | ICD-10-CM | POA: Diagnosis not present

## 2016-08-04 DIAGNOSIS — I25119 Atherosclerotic heart disease of native coronary artery with unspecified angina pectoris: Secondary | ICD-10-CM | POA: Diagnosis not present

## 2016-08-04 DIAGNOSIS — Z471 Aftercare following joint replacement surgery: Secondary | ICD-10-CM | POA: Diagnosis not present

## 2016-08-04 DIAGNOSIS — I10 Essential (primary) hypertension: Secondary | ICD-10-CM | POA: Diagnosis not present

## 2016-08-04 DIAGNOSIS — Z87891 Personal history of nicotine dependence: Secondary | ICD-10-CM | POA: Diagnosis not present

## 2016-08-04 DIAGNOSIS — Z8546 Personal history of malignant neoplasm of prostate: Secondary | ICD-10-CM | POA: Diagnosis not present

## 2016-08-04 DIAGNOSIS — Z96651 Presence of right artificial knee joint: Secondary | ICD-10-CM | POA: Diagnosis not present

## 2016-08-06 DIAGNOSIS — Z87891 Personal history of nicotine dependence: Secondary | ICD-10-CM | POA: Diagnosis not present

## 2016-08-06 DIAGNOSIS — Z96651 Presence of right artificial knee joint: Secondary | ICD-10-CM | POA: Diagnosis not present

## 2016-08-06 DIAGNOSIS — Z471 Aftercare following joint replacement surgery: Secondary | ICD-10-CM | POA: Diagnosis not present

## 2016-08-06 DIAGNOSIS — Z8546 Personal history of malignant neoplasm of prostate: Secondary | ICD-10-CM | POA: Diagnosis not present

## 2016-08-06 DIAGNOSIS — I10 Essential (primary) hypertension: Secondary | ICD-10-CM | POA: Diagnosis not present

## 2016-08-06 DIAGNOSIS — I25119 Atherosclerotic heart disease of native coronary artery with unspecified angina pectoris: Secondary | ICD-10-CM | POA: Diagnosis not present

## 2016-08-10 DIAGNOSIS — Z471 Aftercare following joint replacement surgery: Secondary | ICD-10-CM | POA: Diagnosis not present

## 2016-08-10 DIAGNOSIS — R262 Difficulty in walking, not elsewhere classified: Secondary | ICD-10-CM | POA: Diagnosis not present

## 2016-08-10 DIAGNOSIS — M25661 Stiffness of right knee, not elsewhere classified: Secondary | ICD-10-CM | POA: Diagnosis not present

## 2016-08-10 DIAGNOSIS — M25461 Effusion, right knee: Secondary | ICD-10-CM | POA: Diagnosis not present

## 2016-08-10 DIAGNOSIS — Z96651 Presence of right artificial knee joint: Secondary | ICD-10-CM | POA: Diagnosis not present

## 2016-08-12 DIAGNOSIS — R262 Difficulty in walking, not elsewhere classified: Secondary | ICD-10-CM | POA: Diagnosis not present

## 2016-08-12 DIAGNOSIS — M25661 Stiffness of right knee, not elsewhere classified: Secondary | ICD-10-CM | POA: Diagnosis not present

## 2016-08-12 DIAGNOSIS — M25461 Effusion, right knee: Secondary | ICD-10-CM | POA: Diagnosis not present

## 2016-08-12 DIAGNOSIS — Z96651 Presence of right artificial knee joint: Secondary | ICD-10-CM | POA: Diagnosis not present

## 2016-08-17 DIAGNOSIS — M25461 Effusion, right knee: Secondary | ICD-10-CM | POA: Diagnosis not present

## 2016-08-17 DIAGNOSIS — Z96651 Presence of right artificial knee joint: Secondary | ICD-10-CM | POA: Diagnosis not present

## 2016-08-17 DIAGNOSIS — M25661 Stiffness of right knee, not elsewhere classified: Secondary | ICD-10-CM | POA: Diagnosis not present

## 2016-08-17 DIAGNOSIS — R262 Difficulty in walking, not elsewhere classified: Secondary | ICD-10-CM | POA: Diagnosis not present

## 2016-08-19 DIAGNOSIS — M25461 Effusion, right knee: Secondary | ICD-10-CM | POA: Diagnosis not present

## 2016-08-19 DIAGNOSIS — Z96651 Presence of right artificial knee joint: Secondary | ICD-10-CM | POA: Diagnosis not present

## 2016-08-19 DIAGNOSIS — M25661 Stiffness of right knee, not elsewhere classified: Secondary | ICD-10-CM | POA: Diagnosis not present

## 2016-08-19 DIAGNOSIS — R262 Difficulty in walking, not elsewhere classified: Secondary | ICD-10-CM | POA: Diagnosis not present

## 2016-08-25 DIAGNOSIS — Z96651 Presence of right artificial knee joint: Secondary | ICD-10-CM | POA: Diagnosis not present

## 2016-08-25 DIAGNOSIS — M25661 Stiffness of right knee, not elsewhere classified: Secondary | ICD-10-CM | POA: Diagnosis not present

## 2016-08-25 DIAGNOSIS — R262 Difficulty in walking, not elsewhere classified: Secondary | ICD-10-CM | POA: Diagnosis not present

## 2016-08-25 DIAGNOSIS — M25461 Effusion, right knee: Secondary | ICD-10-CM | POA: Diagnosis not present

## 2016-08-27 DIAGNOSIS — R262 Difficulty in walking, not elsewhere classified: Secondary | ICD-10-CM | POA: Diagnosis not present

## 2016-08-27 DIAGNOSIS — M25661 Stiffness of right knee, not elsewhere classified: Secondary | ICD-10-CM | POA: Diagnosis not present

## 2016-08-27 DIAGNOSIS — M25461 Effusion, right knee: Secondary | ICD-10-CM | POA: Diagnosis not present

## 2016-08-27 DIAGNOSIS — Z96651 Presence of right artificial knee joint: Secondary | ICD-10-CM | POA: Diagnosis not present

## 2016-08-31 DIAGNOSIS — M25461 Effusion, right knee: Secondary | ICD-10-CM | POA: Diagnosis not present

## 2016-08-31 DIAGNOSIS — M25661 Stiffness of right knee, not elsewhere classified: Secondary | ICD-10-CM | POA: Diagnosis not present

## 2016-08-31 DIAGNOSIS — Z96651 Presence of right artificial knee joint: Secondary | ICD-10-CM | POA: Diagnosis not present

## 2016-08-31 DIAGNOSIS — R262 Difficulty in walking, not elsewhere classified: Secondary | ICD-10-CM | POA: Diagnosis not present

## 2016-09-03 DIAGNOSIS — Z96651 Presence of right artificial knee joint: Secondary | ICD-10-CM | POA: Diagnosis not present

## 2016-09-03 DIAGNOSIS — M25461 Effusion, right knee: Secondary | ICD-10-CM | POA: Diagnosis not present

## 2016-09-03 DIAGNOSIS — R262 Difficulty in walking, not elsewhere classified: Secondary | ICD-10-CM | POA: Diagnosis not present

## 2016-09-03 DIAGNOSIS — M25661 Stiffness of right knee, not elsewhere classified: Secondary | ICD-10-CM | POA: Diagnosis not present

## 2016-09-07 DIAGNOSIS — M25661 Stiffness of right knee, not elsewhere classified: Secondary | ICD-10-CM | POA: Diagnosis not present

## 2016-09-07 DIAGNOSIS — M25461 Effusion, right knee: Secondary | ICD-10-CM | POA: Diagnosis not present

## 2016-09-07 DIAGNOSIS — Z96651 Presence of right artificial knee joint: Secondary | ICD-10-CM | POA: Diagnosis not present

## 2016-09-07 DIAGNOSIS — R262 Difficulty in walking, not elsewhere classified: Secondary | ICD-10-CM | POA: Diagnosis not present

## 2016-09-09 DIAGNOSIS — R262 Difficulty in walking, not elsewhere classified: Secondary | ICD-10-CM | POA: Diagnosis not present

## 2016-09-09 DIAGNOSIS — M25461 Effusion, right knee: Secondary | ICD-10-CM | POA: Diagnosis not present

## 2016-09-09 DIAGNOSIS — Z96651 Presence of right artificial knee joint: Secondary | ICD-10-CM | POA: Diagnosis not present

## 2016-09-09 DIAGNOSIS — M25661 Stiffness of right knee, not elsewhere classified: Secondary | ICD-10-CM | POA: Diagnosis not present

## 2016-09-16 DIAGNOSIS — R262 Difficulty in walking, not elsewhere classified: Secondary | ICD-10-CM | POA: Diagnosis not present

## 2016-09-16 DIAGNOSIS — M25461 Effusion, right knee: Secondary | ICD-10-CM | POA: Diagnosis not present

## 2016-09-16 DIAGNOSIS — Z96651 Presence of right artificial knee joint: Secondary | ICD-10-CM | POA: Diagnosis not present

## 2016-09-16 DIAGNOSIS — M25661 Stiffness of right knee, not elsewhere classified: Secondary | ICD-10-CM | POA: Diagnosis not present

## 2016-12-24 DIAGNOSIS — D1801 Hemangioma of skin and subcutaneous tissue: Secondary | ICD-10-CM | POA: Diagnosis not present

## 2016-12-24 DIAGNOSIS — L821 Other seborrheic keratosis: Secondary | ICD-10-CM | POA: Diagnosis not present

## 2016-12-24 DIAGNOSIS — D2261 Melanocytic nevi of right upper limb, including shoulder: Secondary | ICD-10-CM | POA: Diagnosis not present

## 2016-12-24 DIAGNOSIS — D2271 Melanocytic nevi of right lower limb, including hip: Secondary | ICD-10-CM | POA: Diagnosis not present

## 2016-12-24 DIAGNOSIS — L570 Actinic keratosis: Secondary | ICD-10-CM | POA: Diagnosis not present

## 2016-12-24 DIAGNOSIS — L578 Other skin changes due to chronic exposure to nonionizing radiation: Secondary | ICD-10-CM | POA: Diagnosis not present

## 2016-12-24 DIAGNOSIS — L738 Other specified follicular disorders: Secondary | ICD-10-CM | POA: Diagnosis not present

## 2016-12-24 DIAGNOSIS — D2262 Melanocytic nevi of left upper limb, including shoulder: Secondary | ICD-10-CM | POA: Diagnosis not present

## 2016-12-24 DIAGNOSIS — L812 Freckles: Secondary | ICD-10-CM | POA: Diagnosis not present

## 2016-12-24 DIAGNOSIS — L720 Epidermal cyst: Secondary | ICD-10-CM | POA: Diagnosis not present

## 2017-01-12 ENCOUNTER — Emergency Department (HOSPITAL_COMMUNITY)
Admission: EM | Admit: 2017-01-12 | Discharge: 2017-01-12 | Disposition: A | Discharge status: Home / Self Care | Payer: Medicare Other

## 2017-01-12 NOTE — ED Notes (Signed)
Pt wife  Decided to take the pt home she will take him to urgent care tomorrow she thinks he has the flu  Not willing to wait any longer

## 2017-01-13 ENCOUNTER — Ambulatory Visit (INDEPENDENT_AMBULATORY_CARE_PROVIDER_SITE_OTHER): Payer: Medicare Other

## 2017-01-13 ENCOUNTER — Ambulatory Visit (INDEPENDENT_AMBULATORY_CARE_PROVIDER_SITE_OTHER): Payer: Medicare Other | Admitting: Physician Assistant

## 2017-01-13 VITALS — BP 152/72 | HR 64 | Temp 98.7°F | Ht 70.9 in | Wt 199.2 lb

## 2017-01-13 DIAGNOSIS — R05 Cough: Secondary | ICD-10-CM

## 2017-01-13 DIAGNOSIS — R058 Other specified cough: Secondary | ICD-10-CM

## 2017-01-13 DIAGNOSIS — R6889 Other general symptoms and signs: Secondary | ICD-10-CM

## 2017-01-13 MED ORDER — GUAIFENESIN ER 1200 MG PO TB12: 1.0000 | ORAL_TABLET | Freq: Two times a day (BID) | ORAL | 1 refills | Status: DC | PRN

## 2017-01-13 MED ORDER — OSELTAMIVIR PHOSPHATE 75 MG PO CAPS: 75.0000 mg | ORAL_CAPSULE | Freq: Two times a day (BID) | ORAL | 0 refills | Status: DC

## 2017-01-13 MED ORDER — BENZONATATE 100 MG PO CAPS: 100.0000 mg | ORAL_CAPSULE | Freq: Three times a day (TID) | ORAL | 0 refills | Status: DC | PRN

## 2017-01-13 MED ORDER — HYDROCOD POLST-CPM POLST ER 10-8 MG/5ML PO SUER: 5.0000 mL | Freq: Every evening | ORAL | 0 refills | Status: DC | PRN

## 2017-01-13 NOTE — Patient Instructions (Addendum)
Please take medication as prescribed. I would like you to use the mucinex as prescribed.   Please use tylenol for pain and fever.    Influenza, Adult Influenza ("the flu") is an infection in the lungs, nose, and throat (respiratory tract). It is caused by a virus. The flu causes many common cold symptoms, as well as a high fever and body aches. It can make you feel very sick. The flu spreads easily from person to person (is contagious). Getting a flu shot (influenza vaccination) every year is the best way to prevent the flu. Follow these instructions at home:  Take over-the-counter and prescription medicines only as told by your doctor.  Use a cool mist humidifier to add moisture (humidity) to the air in your home. This can make it easier to breathe.  Rest as needed.  Drink enough fluid to keep your pee (urine) clear or pale yellow.  Cover your mouth and nose when you cough or sneeze.  Wash your hands with soap and water often, especially after you cough or sneeze. If you cannot use soap and water, use hand sanitizer.  Stay home from work or school as told by your doctor. Unless you are visiting your doctor, try to avoid leaving home until your fever has been gone for 24 hours without the use of medicine.  Keep all follow-up visits as told by your doctor. This is important. How is this prevented?  Getting a yearly (annual) flu shot is the best way to avoid getting the flu. You may get the flu shot in late summer, fall, or winter. Ask your doctor when you should get your flu shot.  Wash your hands often or use hand sanitizer often.  Avoid contact with people who are sick during cold and flu season.  Eat healthy foods.  Drink plenty of fluids.  Get enough sleep.  Exercise regularly. Contact a doctor if:  You get new symptoms.  You have:  Chest pain.  Watery poop (diarrhea).  A fever.  Your cough gets worse.  You start to have more mucus.  You feel sick to your  stomach (nauseous).  You throw up (vomit). Get help right away if:  You start to be short of breath or have trouble breathing.  Your skin or nails turn a bluish color.  You have very bad pain or stiffness in your neck.  You get a sudden headache.  You get sudden pain in your face or ear.  You cannot stop throwing up. This information is not intended to replace advice given to you by your health care provider. Make sure you discuss any questions you have with your health care provider. Document Released: 08/31/2008 Document Revised: 04/29/2016 Document Reviewed: 09/16/2015 Elsevier Interactive Patient Education  2017 [employer].

## 2017-01-13 NOTE — Progress Notes (Signed)
[facility] [address] [place] [address] [phone]  Date:  01/13/2017   Name:  Timothy Mendez   DOB:  [birth year]   MRN:  [id number]  PCP:  Mathews Argyle, MD    History of Present Illness:  Timothy Mendez is a 71 y.o. male patient who presents to [facility] for cc of cough for 2 days.    --started 4 days ago stuffy nose, headache, and cough.  The cough is progressively worsening.  He has had 102.6 fever, malaise.  Cough is painful with rib pain.  Brown phlegm.  Diaphoresis.  No windedness, sob, or dyspnea.  No ear pain.  No dizziness.     He is taking acetaminophen, mucinex, .  Last administered tylenol at 4am.   Patient Active Problem List   Diagnosis Date Noted  . 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 Coronary artery disease involving native coronary artery of native heart with angina pectoris ([place])   . Chest pain 03/14/2015  . Pain in the chest   . Hypertension 03/13/2015  . CAD (coronary artery disease), native coronary artery 03/13/2015  . Unstable angina pectoris ([place])   . History of prostate cancer     Past Medical History:  Diagnosis Date  . Anginal pain ([place])   . Coronary artery disease   . GERD (gastroesophageal reflux disease)   . Hypertension   . Osteoarthritis    knees  . Prostate cancer [facility])     Past Surgical History:  Procedure Laterality Date  . CARDIAC CATHETERIZATION  03/14/2015   Procedure: CORONARY STENT INTERVENTION;  Surgeon: Jettie Booze, MD;  Location: [facility] CATH LAB;  Service: Cardiovascular;;  Prox RCA  . CORONARY ANGIOPLASTY WITH STENT PLACEMENT  03/14/2015   "1"  . INGUINAL HERNIA REPAIR Right    as a child  . JOINT REPLACEMENT    . KNEE ARTHROSCOPY Right X 2  . LEFT HEART CATHETERIZATION WITH CORONARY ANGIOGRAM N/A 03/14/2015   Procedure: LEFT HEART CATHETERIZATION WITH CORONARY ANGIOGRAM;  Surgeon: Jettie Booze, MD;  Location: [facility] CATH LAB;  Service: Cardiovascular;  Laterality: N/A;  .  PROSTATECTOMY  12/2000   Archie Endo 04/21/2011  . SHOULDER ARTHROSCOPY Right 09/2007   Archie Endo 04/07/2011  . TONSILLECTOMY    . TOTAL KNEE ARTHROPLASTY Right 07/26/2016   Procedure: RIGHT TOTAL KNEE ARTHROPLASTY;  Surgeon: Vickey Huger, MD;  Location: [place];  Service: Orthopedics;  Laterality: Right;    Social History  Substance Use Topics  . Smoking status: Former Smoker    Packs/day: 1.00    Years: 5.00    Types: Cigarettes  . Smokeless tobacco: Never Used     Comment: "quit smoking in 1976"  . Alcohol use 0.0 oz/week     Comment: 03/14/2015 "Might have a drink a couple times/month"    Family History  Problem Relation Age of Onset  . Heart disease Mother   . Lung cancer Father   . Cancer Father   . Diabetes Brother   . Heart attack Brother     Allergies  Allergen Reactions  . No Known Allergies     Medication list has been reviewed and updated.  Current Outpatient Prescriptions on File Prior to Visit  Medication Sig Dispense Refill  . acetaminophen (TYLENOL) 500 MG tablet Take 500 mg by mouth every 6 (six) hours as needed.    Marland Kitchen aspirin EC 81 MG tablet Take 81 mg by mouth daily.    Marland Kitchen atorvastatin (LIPITOR)  40 MG tablet Take 1 tablet (40 mg total) by mouth daily at 6 PM. (Patient taking differently: Take 20 mg by mouth daily at 6 PM. ) 30 tablet 0  . Cholecalciferol (VITAMIN D PO) Take 1 tablet by mouth daily.    . metoprolol (LOPRESSOR) 100 MG tablet Take 100 mg by mouth 2 (two) times daily.    . Multiple Vitamins-Minerals (MULTIVITAMIN PO) Take 1 tablet by mouth daily.    . nitroGLYCERIN (NITROSTAT) 0.4 MG SL tablet Place 0.4 mg under the tongue every 5 (five) minutes as needed for chest pain.    . Omega-3 Fatty Acids (FISH OIL PO) Take 1 tablet by mouth daily.    . clopidogrel (PLAVIX) 75 MG tablet Take 1 tablet (75 mg total) by mouth daily with breakfast. (Patient not taking: Reported on 01/13/2017) 30 tablet 0  . Glucosamine-Chondroitin (GLUCOSAMINE CHONDR COMPLEX PO) Take 1  tablet by mouth 2 (two) times daily.      No current facility-administered medications on file prior to visit.     ROS ROS otherwise unremarkable unless listed above.   Physical Examination: BP (!) 152/72   Pulse 64   Temp 98.7 F (37.1 C) (Oral)   Ht 5' 10.9" (1.801 m)   Wt 199 lb 3.2 oz (90.4 kg)   SpO2 95%   BMI 27.86 kg/m  Ideal Body Weight: Weight in (lb) to have BMI = 25: 178.4  Physical Exam  Constitutional: He is oriented to person, place, and time. He appears well-developed and well-nourished. No distress.  HENT:  Head: Atraumatic.  Right Ear: Tympanic membrane, external ear and ear canal normal.  Left Ear: Tympanic membrane, external ear and ear canal normal.  Nose: Mucosal edema and rhinorrhea present. Right sinus exhibits no maxillary sinus tenderness and no frontal sinus tenderness. Left sinus exhibits no maxillary sinus tenderness and no frontal sinus tenderness.  Mouth/Throat: No uvula swelling. No oropharyngeal exudate, posterior oropharyngeal edema or posterior oropharyngeal erythema.  Eyes: Conjunctivae, EOM and lids are normal. Pupils are equal, round, and reactive to light. Right eye exhibits normal extraocular motion. Left eye exhibits normal extraocular motion.  Neck: Trachea normal and full passive range of motion without pain. No edema and no erythema present.  Cardiovascular: Normal rate.   Pulmonary/Chest: Effort normal. No respiratory distress. He has no decreased breath sounds. He has no wheezes. He has no rhonchi.  Left lobe crackling.   Neurological: He is alert and oriented to person, place, and time.  Skin: Skin is warm and dry. He is not diaphoretic.  Psychiatric: He has a normal mood and affect. His behavior is normal.   Results for orders placed or performed during the hospital encounter of 07/26/16  CBC  Result Value Ref Range   WBC 11.8 (H) 4.0 - 10.5 K/uL   RBC 4.31 4.22 - 5.81 MIL/uL   Hemoglobin 14.0 13.0 - 17.0 g/dL   HCT 41.5 39.0 -  52.0 %   MCV 96.3 78.0 - 100.0 fL   MCH 32.5 26.0 - 34.0 pg   MCHC 33.7 30.0 - 36.0 g/dL   RDW 12.5 11.5 - 15.5 %   Platelets 169 150 - 400 K/uL  Basic metabolic panel  Result Value Ref Range   Sodium 135 135 - 145 mmol/L   Potassium 3.8 3.5 - 5.1 mmol/L   Chloride 96 (L) 101 - 111 mmol/L   CO2 30 22 - 32 mmol/L   Glucose, Bld 122 (H) 65 - 99 mg/dL   BUN  10 6 - 20 mg/dL   Creatinine, Ser 1.08 0.61 - 1.24 mg/dL   Calcium 9.1 8.9 - 10.3 mg/dL   GFR calc non Af Amer >60 >60 mL/min   GFR calc Af Amer >60 >60 mL/min   Anion gap 9 5 - 15   Dg Chest 2 View  Result Date: 01/13/2017 CLINICAL DATA:  71 year old male with productive cough. Initial encounter. EXAM: CHEST  2 VIEW COMPARISON:  07/13/2016 and earlier. FINDINGS: Normal lung volumes. Normal cardiac size and mediastinal contours. Visualized tracheal air column is within normal limits. The lungs are clear. No pneumothorax or pleural effusion. No acute osseous abnormality identified. Negative visible bowel gas pattern. IMPRESSION: Negative.  No acute cardiopulmonary abnormality. Electronically Signed   By: Genevie Ann M.D.   On: 01/13/2017 09:23   Assessment and Plan: Timothy Mendez is a 71 y.o. male who is here today for cc of cough.  Flu-like symptoms - Plan: oseltamivir (TAMIFLU) 75 MG capsule, Guaifenesin (MUCINEX MAXIMUM STRENGTH) 1200 MG TB12, benzonatate (TESSALON) 100 MG capsule, chlorpheniramine-HYDROcodone (TUSSIONEX PENNKINETIC ER) 10-8 MG/5ML SUER  Productive cough - Plan: DG Chest 2 View  Ivar Drape, PA-C [facility] 2/27/20188:41 AM

## 2017-01-21 DIAGNOSIS — Z955 Presence of coronary angioplasty implant and graft: Secondary | ICD-10-CM | POA: Diagnosis not present

## 2017-01-21 DIAGNOSIS — K219 Gastro-esophageal reflux disease without esophagitis: Secondary | ICD-10-CM | POA: Diagnosis not present

## 2017-01-21 DIAGNOSIS — I2510 Atherosclerotic heart disease of native coronary artery without angina pectoris: Secondary | ICD-10-CM | POA: Diagnosis not present

## 2017-01-21 DIAGNOSIS — E785 Hyperlipidemia, unspecified: Secondary | ICD-10-CM | POA: Diagnosis not present

## 2017-01-21 DIAGNOSIS — Z8546 Personal history of malignant neoplasm of prostate: Secondary | ICD-10-CM | POA: Diagnosis not present

## 2017-01-21 DIAGNOSIS — I10 Essential (primary) hypertension: Secondary | ICD-10-CM | POA: Diagnosis not present

## 2017-01-25 DIAGNOSIS — Z471 Aftercare following joint replacement surgery: Secondary | ICD-10-CM | POA: Diagnosis not present

## 2017-01-25 DIAGNOSIS — Z96651 Presence of right artificial knee joint: Secondary | ICD-10-CM | POA: Diagnosis not present

## 2017-01-31 DIAGNOSIS — Z79899 Other long term (current) drug therapy: Secondary | ICD-10-CM | POA: Diagnosis not present

## 2017-01-31 DIAGNOSIS — Z8546 Personal history of malignant neoplasm of prostate: Secondary | ICD-10-CM | POA: Diagnosis not present

## 2017-01-31 DIAGNOSIS — Z1211 Encounter for screening for malignant neoplasm of colon: Secondary | ICD-10-CM | POA: Diagnosis not present

## 2017-01-31 DIAGNOSIS — I25110 Atherosclerotic heart disease of native coronary artery with unstable angina pectoris: Secondary | ICD-10-CM | POA: Diagnosis not present

## 2017-01-31 DIAGNOSIS — Z1389 Encounter for screening for other disorder: Secondary | ICD-10-CM | POA: Diagnosis not present

## 2017-01-31 DIAGNOSIS — Z125 Encounter for screening for malignant neoplasm of prostate: Secondary | ICD-10-CM | POA: Diagnosis not present

## 2017-01-31 DIAGNOSIS — Z0000 Encounter for general adult medical examination without abnormal findings: Secondary | ICD-10-CM | POA: Diagnosis not present

## 2017-01-31 DIAGNOSIS — E7800 Pure hypercholesterolemia, unspecified: Secondary | ICD-10-CM | POA: Diagnosis not present

## 2017-05-30 DIAGNOSIS — H5203 Hypermetropia, bilateral: Secondary | ICD-10-CM | POA: Diagnosis not present

## 2017-05-30 DIAGNOSIS — H52223 Regular astigmatism, bilateral: Secondary | ICD-10-CM | POA: Diagnosis not present

## 2017-05-30 DIAGNOSIS — H2513 Age-related nuclear cataract, bilateral: Secondary | ICD-10-CM | POA: Diagnosis not present

## 2017-05-30 DIAGNOSIS — H524 Presbyopia: Secondary | ICD-10-CM | POA: Diagnosis not present

## 2017-06-21 DIAGNOSIS — H903 Sensorineural hearing loss, bilateral: Secondary | ICD-10-CM | POA: Diagnosis not present

## 2017-06-21 DIAGNOSIS — H6123 Impacted cerumen, bilateral: Secondary | ICD-10-CM | POA: Diagnosis not present

## 2017-07-19 DIAGNOSIS — H903 Sensorineural hearing loss, bilateral: Secondary | ICD-10-CM | POA: Diagnosis not present

## 2017-07-26 DIAGNOSIS — M1712 Unilateral primary osteoarthritis, left knee: Secondary | ICD-10-CM | POA: Diagnosis not present

## 2017-07-26 DIAGNOSIS — M25562 Pain in left knee: Secondary | ICD-10-CM | POA: Diagnosis not present

## 2017-07-26 DIAGNOSIS — Z96651 Presence of right artificial knee joint: Secondary | ICD-10-CM | POA: Diagnosis not present

## 2017-07-26 DIAGNOSIS — G8929 Other chronic pain: Secondary | ICD-10-CM | POA: Diagnosis not present

## 2017-07-26 DIAGNOSIS — Z471 Aftercare following joint replacement surgery: Secondary | ICD-10-CM | POA: Diagnosis not present

## 2017-08-09 ENCOUNTER — Other Ambulatory Visit: Payer: Self-pay | Admitting: Orthopedic Surgery

## 2017-08-09 DIAGNOSIS — M25562 Pain in left knee: Principal | ICD-10-CM

## 2017-08-09 DIAGNOSIS — G8929 Other chronic pain: Secondary | ICD-10-CM

## 2017-08-16 ENCOUNTER — Other Ambulatory Visit: Payer: Medicare Other

## 2017-08-16 DIAGNOSIS — D1801 Hemangioma of skin and subcutaneous tissue: Secondary | ICD-10-CM | POA: Diagnosis not present

## 2017-08-16 DIAGNOSIS — L570 Actinic keratosis: Secondary | ICD-10-CM | POA: Diagnosis not present

## 2017-08-16 DIAGNOSIS — L578 Other skin changes due to chronic exposure to nonionizing radiation: Secondary | ICD-10-CM | POA: Diagnosis not present

## 2017-08-16 DIAGNOSIS — L821 Other seborrheic keratosis: Secondary | ICD-10-CM | POA: Diagnosis not present

## 2017-08-17 ENCOUNTER — Ambulatory Visit
Admission: RE | Admit: 2017-08-17 | Discharge: 2017-08-17 | Disposition: A | Discharge status: Home / Self Care | Payer: Medicare Other | Source: Ambulatory Visit | Attending: Orthopedic Surgery | Admitting: Orthopedic Surgery

## 2017-08-17 DIAGNOSIS — G8929 Other chronic pain: Secondary | ICD-10-CM

## 2017-08-17 DIAGNOSIS — M25562 Pain in left knee: Principal | ICD-10-CM

## 2017-08-17 DIAGNOSIS — S83272A Complex tear of lateral meniscus, current injury, left knee, initial encounter: Secondary | ICD-10-CM | POA: Diagnosis not present

## 2017-08-23 DIAGNOSIS — M1712 Unilateral primary osteoarthritis, left knee: Secondary | ICD-10-CM | POA: Diagnosis not present

## 2017-08-23 DIAGNOSIS — S83282A Other tear of lateral meniscus, current injury, left knee, initial encounter: Secondary | ICD-10-CM | POA: Diagnosis not present

## 2017-08-23 DIAGNOSIS — G8929 Other chronic pain: Secondary | ICD-10-CM | POA: Diagnosis not present

## 2017-09-12 DIAGNOSIS — Z23 Encounter for immunization: Secondary | ICD-10-CM | POA: Diagnosis not present

## 2017-12-29 DIAGNOSIS — L812 Freckles: Secondary | ICD-10-CM | POA: Diagnosis not present

## 2017-12-29 DIAGNOSIS — D1801 Hemangioma of skin and subcutaneous tissue: Secondary | ICD-10-CM | POA: Diagnosis not present

## 2017-12-29 DIAGNOSIS — L821 Other seborrheic keratosis: Secondary | ICD-10-CM | POA: Diagnosis not present

## 2017-12-29 DIAGNOSIS — L570 Actinic keratosis: Secondary | ICD-10-CM | POA: Diagnosis not present

## 2017-12-29 DIAGNOSIS — L304 Erythema intertrigo: Secondary | ICD-10-CM | POA: Diagnosis not present

## 2018-01-21 IMAGING — MR MR KNEE*L* W/O CM
4 of 5 series · 16 of 40 positions shown · non-contrast
Comparison: None.

CLINICAL DATA: Chronic knee pain with locking.

EXAM:
MRI OF THE LEFT KNEE WITHOUT CONTRAST
TECHNIQUE: Multiplanar, multisequence MR imaging of the knee was performed. No
intravenous contrast was administered.

[Series 3: PD · axial · 4.0mm · 0.62mm/px · z∈[-38,+39]mm · 3 of 24 slices shown (1 of 2)]
[im 4/24]
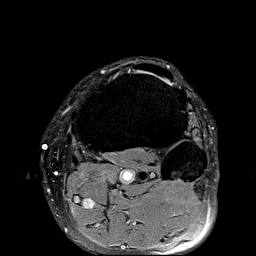
[im 14/24]
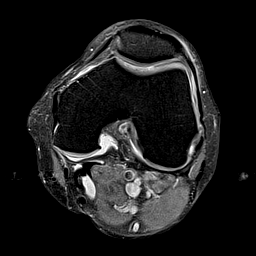
[im 20/24]
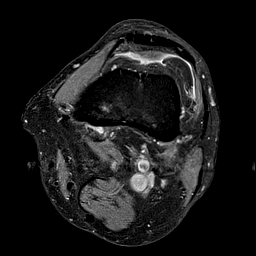

[Series 4: PD fat-sat · sagittal · 4.0mm · 0.21mm/px · 7 of 24 slices shown]
[im 1/24]
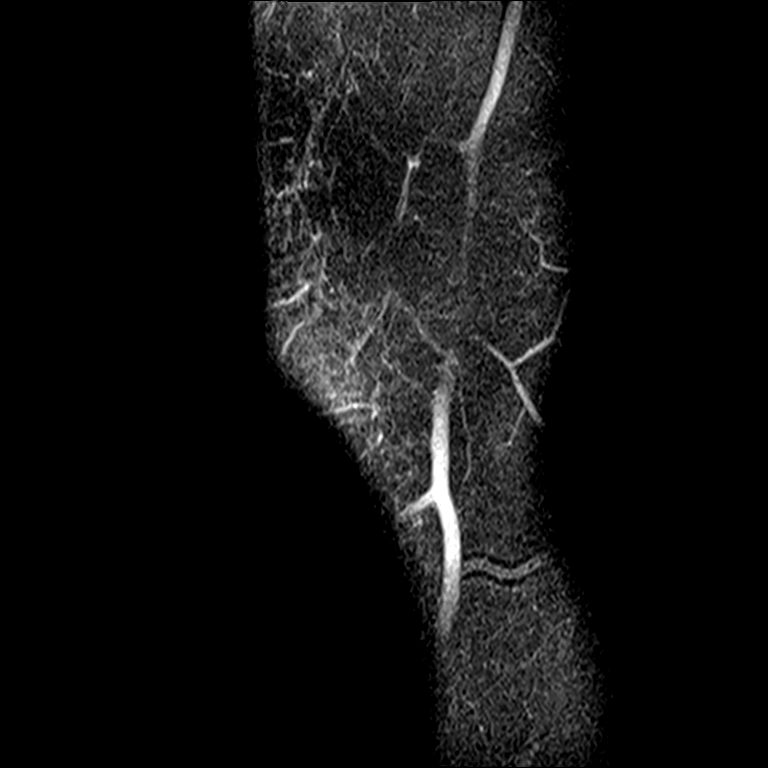
[im 4/24]
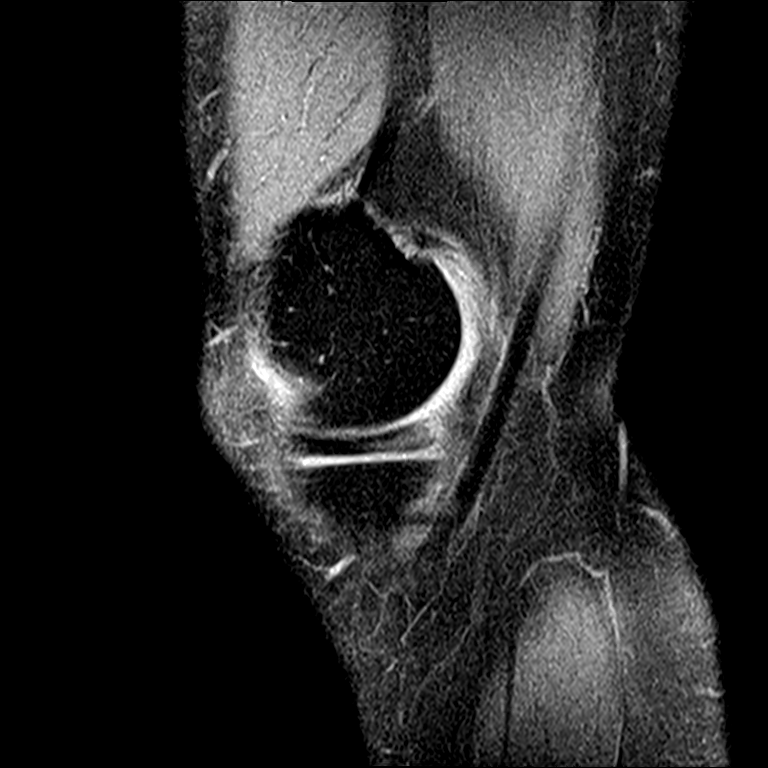
[im 7/24]
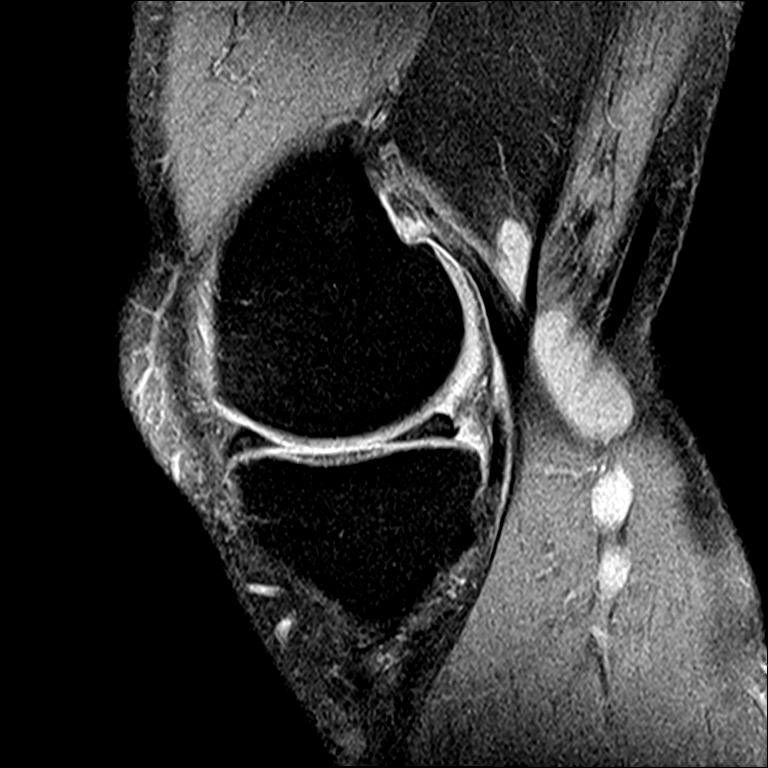
[im 10/24]
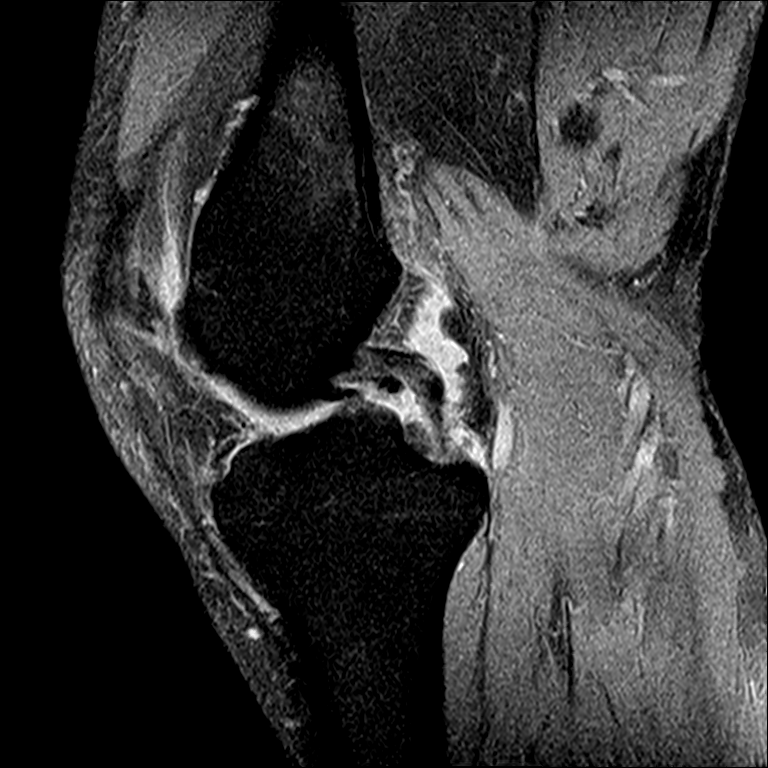
[im 14/24]
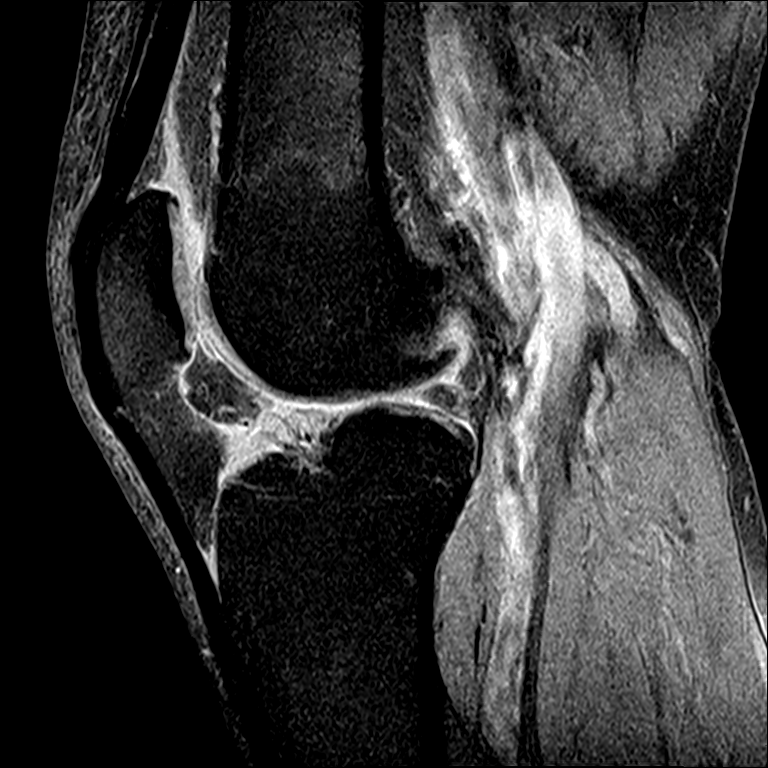
[im 17/24]
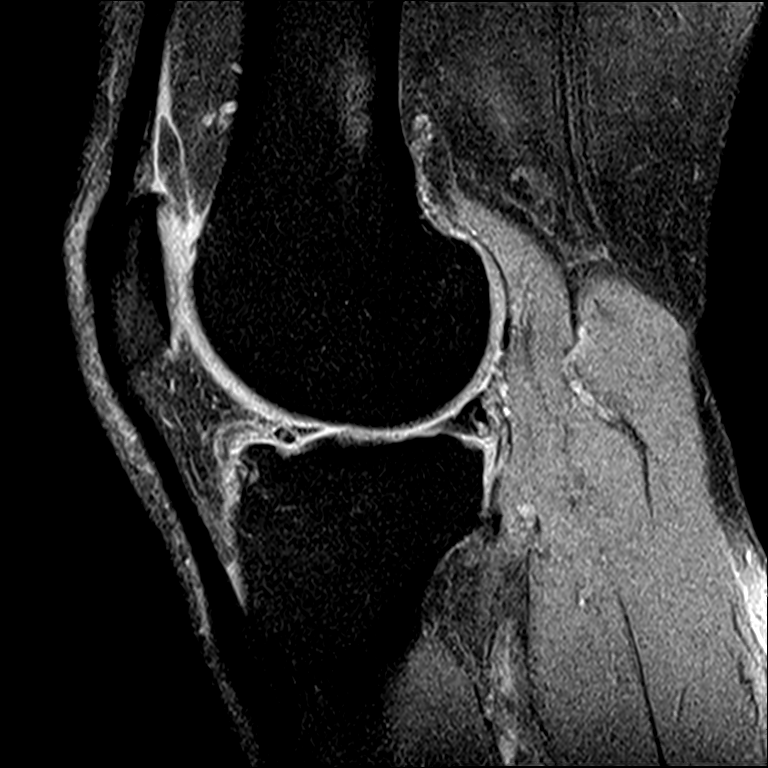
[im 20/24]
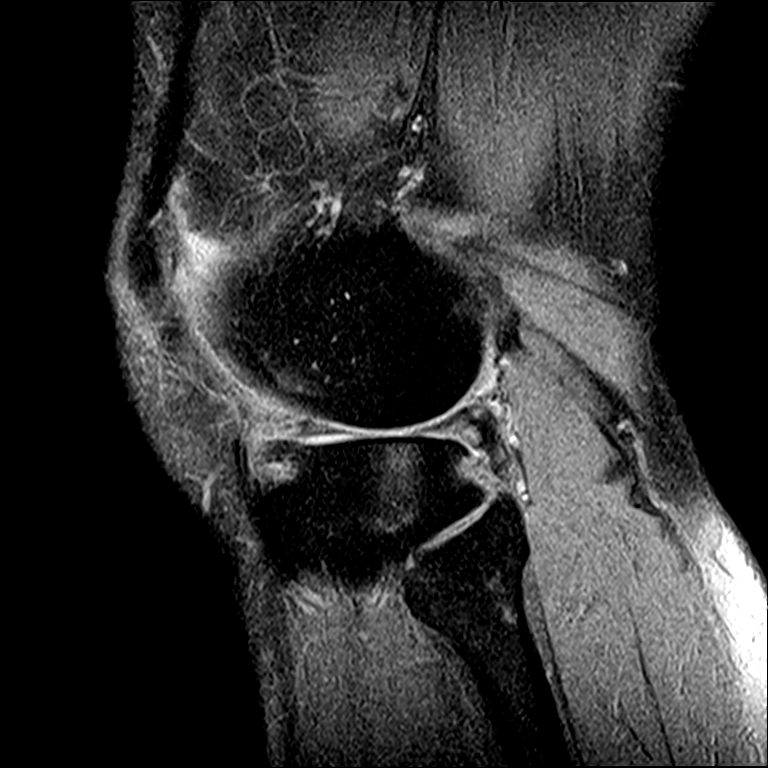

[Series 5: PD · coronal · 4.0mm · 0.66mm/px · 3 of 22 slices shown (2 of 2)]
[im 4/22]
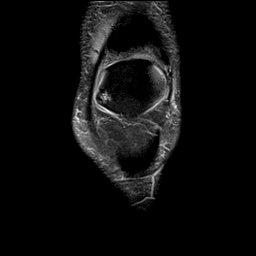
[im 13/22]
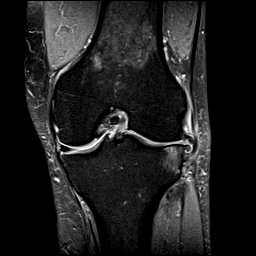
[im 19/22]
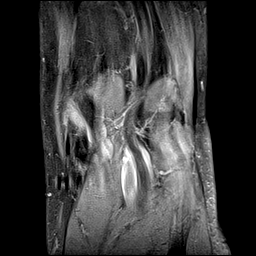

[Series 8: T1 · coronal · 4.0mm · 0.66mm/px · 3 of 22 slices shown]
[im 4/22]
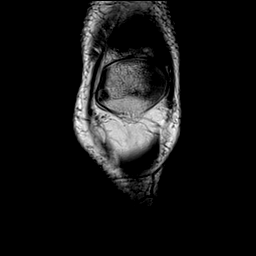
[im 13/22]
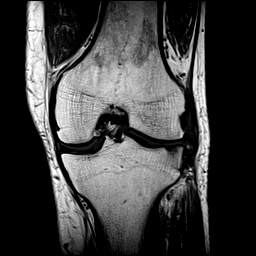
[im 19/22]
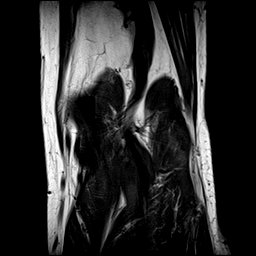

[16 of 40 positions shown; findings below may reference images not displayed]

FINDINGS: MENISCI

Medial meniscus:  Intact.  Mild intrasubstance degenerative changes.

Lateral meniscus: Complex and fairly extensive tear involving the
posterior horn midbody junction region and extending all the way
into the anterior horn. This is largely and inferior articular
surface tear with both horizontal and longitudinal components.

LIGAMENTS

Cruciates:  Intact

Collaterals:  Intact

CARTILAGE

Patellofemoral: Mild to moderate degenerative chondrosis with areas
of cartilage thinning and subchondral cystic change.

Medial: Mild degenerative chondrosis for age. No full-thickness
cartilage defects or significant joint space narrowing.

Lateral: Advanced degenerative chondrosis with areas of full or near
full-thickness cartilage loss, joint space narrowing, osteophytic
spurring and subchondral cystic change.

Joint:  No joint effusion.  Mild synovitis.

Popliteal Fossa:  Small Baker cyst.

Extensor Mechanism: The patella retinacular structures are intact
and the quadriceps and patellar tendons are intact.

Bones:  No acute bony findings.

Other: Normal knee musculature.
IMPRESSION: 1. Complex and fairly extensive lateral meniscus tear with
associated significant lateral compartment degenerative changes as
described above.
2. Intact ligamentous structures and no acute bony findings.
3. No joint effusion.  Small Baker's cyst.

## 2018-01-27 DIAGNOSIS — I2510 Atherosclerotic heart disease of native coronary artery without angina pectoris: Secondary | ICD-10-CM | POA: Diagnosis not present

## 2018-01-27 DIAGNOSIS — I10 Essential (primary) hypertension: Secondary | ICD-10-CM | POA: Diagnosis not present

## 2018-01-27 DIAGNOSIS — K219 Gastro-esophageal reflux disease without esophagitis: Secondary | ICD-10-CM | POA: Diagnosis not present

## 2018-01-27 DIAGNOSIS — Z8546 Personal history of malignant neoplasm of prostate: Secondary | ICD-10-CM | POA: Diagnosis not present

## 2018-01-27 DIAGNOSIS — Z955 Presence of coronary angioplasty implant and graft: Secondary | ICD-10-CM | POA: Diagnosis not present

## 2018-01-27 DIAGNOSIS — E7849 Other hyperlipidemia: Secondary | ICD-10-CM | POA: Diagnosis not present

## 2018-02-03 DIAGNOSIS — Z1389 Encounter for screening for other disorder: Secondary | ICD-10-CM | POA: Diagnosis not present

## 2018-02-03 DIAGNOSIS — Z79899 Other long term (current) drug therapy: Secondary | ICD-10-CM | POA: Diagnosis not present

## 2018-02-03 DIAGNOSIS — Z0000 Encounter for general adult medical examination without abnormal findings: Secondary | ICD-10-CM | POA: Diagnosis not present

## 2018-02-03 DIAGNOSIS — Z8546 Personal history of malignant neoplasm of prostate: Secondary | ICD-10-CM | POA: Diagnosis not present

## 2018-02-03 DIAGNOSIS — Z1211 Encounter for screening for malignant neoplasm of colon: Secondary | ICD-10-CM | POA: Diagnosis not present

## 2018-02-03 DIAGNOSIS — E7800 Pure hypercholesterolemia, unspecified: Secondary | ICD-10-CM | POA: Diagnosis not present

## 2018-05-15 DIAGNOSIS — Z1211 Encounter for screening for malignant neoplasm of colon: Secondary | ICD-10-CM | POA: Diagnosis not present

## 2018-05-24 DIAGNOSIS — S83272A Complex tear of lateral meniscus, current injury, left knee, initial encounter: Secondary | ICD-10-CM | POA: Diagnosis not present

## 2018-05-24 DIAGNOSIS — G8918 Other acute postprocedural pain: Secondary | ICD-10-CM | POA: Diagnosis not present

## 2018-05-24 DIAGNOSIS — Y999 Unspecified external cause status: Secondary | ICD-10-CM | POA: Diagnosis not present

## 2018-05-24 DIAGNOSIS — X58XXXA Exposure to other specified factors, initial encounter: Secondary | ICD-10-CM | POA: Diagnosis not present

## 2018-05-24 DIAGNOSIS — M659 Synovitis and tenosynovitis, unspecified: Secondary | ICD-10-CM | POA: Diagnosis not present

## 2018-05-24 DIAGNOSIS — M6752 Plica syndrome, left knee: Secondary | ICD-10-CM | POA: Diagnosis not present

## 2018-05-24 DIAGNOSIS — M94262 Chondromalacia, left knee: Secondary | ICD-10-CM | POA: Diagnosis not present

## 2018-05-29 DIAGNOSIS — S83282S Other tear of lateral meniscus, current injury, left knee, sequela: Secondary | ICD-10-CM | POA: Diagnosis not present

## 2018-05-29 DIAGNOSIS — Z9889 Other specified postprocedural states: Secondary | ICD-10-CM | POA: Diagnosis not present

## 2018-05-29 DIAGNOSIS — M25662 Stiffness of left knee, not elsewhere classified: Secondary | ICD-10-CM | POA: Diagnosis not present

## 2018-05-30 DIAGNOSIS — H52223 Regular astigmatism, bilateral: Secondary | ICD-10-CM | POA: Diagnosis not present

## 2018-05-30 DIAGNOSIS — H04123 Dry eye syndrome of bilateral lacrimal glands: Secondary | ICD-10-CM | POA: Diagnosis not present

## 2018-05-30 DIAGNOSIS — H5203 Hypermetropia, bilateral: Secondary | ICD-10-CM | POA: Diagnosis not present

## 2018-05-30 DIAGNOSIS — H524 Presbyopia: Secondary | ICD-10-CM | POA: Diagnosis not present

## 2018-07-14 DIAGNOSIS — S80861A Insect bite (nonvenomous), right lower leg, initial encounter: Secondary | ICD-10-CM | POA: Diagnosis not present

## 2018-07-14 DIAGNOSIS — S80869A Insect bite (nonvenomous), unspecified lower leg, initial encounter: Secondary | ICD-10-CM | POA: Diagnosis not present

## 2018-07-14 DIAGNOSIS — D2271 Melanocytic nevi of right lower limb, including hip: Secondary | ICD-10-CM | POA: Diagnosis not present

## 2018-07-14 DIAGNOSIS — L282 Other prurigo: Secondary | ICD-10-CM | POA: Diagnosis not present

## 2018-10-12 DIAGNOSIS — D1801 Hemangioma of skin and subcutaneous tissue: Secondary | ICD-10-CM | POA: Diagnosis not present

## 2018-11-07 DIAGNOSIS — H903 Sensorineural hearing loss, bilateral: Secondary | ICD-10-CM | POA: Diagnosis not present

## 2018-11-21 DIAGNOSIS — H6123 Impacted cerumen, bilateral: Secondary | ICD-10-CM | POA: Diagnosis not present

## 2019-01-05 ENCOUNTER — Encounter: Payer: Self-pay | Admitting: Physician Assistant

## 2019-01-05 ENCOUNTER — Ambulatory Visit (INDEPENDENT_AMBULATORY_CARE_PROVIDER_SITE_OTHER): Payer: Medicare Other | Admitting: Physician Assistant

## 2019-01-05 VITALS — BP 132/76 | HR 53 | Ht 70.0 in | Wt 202.2 lb

## 2019-01-05 DIAGNOSIS — I2510 Atherosclerotic heart disease of native coronary artery without angina pectoris: Secondary | ICD-10-CM | POA: Diagnosis not present

## 2019-01-05 DIAGNOSIS — R072 Precordial pain: Secondary | ICD-10-CM | POA: Diagnosis not present

## 2019-01-05 DIAGNOSIS — I10 Essential (primary) hypertension: Secondary | ICD-10-CM | POA: Diagnosis not present

## 2019-01-05 MED ORDER — NITROGLYCERIN 0.4 MG SL SUBL: 0.4000 mg | SUBLINGUAL_TABLET | SUBLINGUAL | 4 refills | Status: DC | PRN

## 2019-01-05 NOTE — Patient Instructions (Signed)
Medication Instructions:  Your physician recommends that you continue on your current medications as directed. Please refer to the Current Medication list given to you today.  If you need a refill on your cardiac medications before your next appointment, please call your pharmacy.    Testing/Procedures: Your physician has requested that you have an exercise stress myoview--next Monday or Tuesday. For further information please visit [email or web address]. Please follow instruction sheet, as given.  HOLD Metoprolol the night before and the morning of your test. Do Not eat anything after midnight the night before. Do not consume products containing caffeine (regular or decaffeinated) 12 hours prior. (ex. Coffee, chocolate, sodas, tea) Wear comfortable clothing including walking shoes or tennis shoes. Do not wear cologne, perfume, aftershave, or lotions (deodorant is allowed.  Follow-Up: At [facility], you and your health needs are our priority.  As part of our continuing mission to provide you with exceptional heart care, we have created designated Provider Care Teams.  These Care Teams include your primary Cardiologist (physician) and Advanced Practice Providers (APPs -  Physician Assistants and Nurse Practitioners) who all work together to provide you with the care you need, when you need it. . Please schedule a follow-up appointment to see Almyra Deforest, PA in 1-2 weeks.  Any Other Special Instructions Will Be Listed Below (If Applicable). None

## 2019-01-05 NOTE — Progress Notes (Signed)
Cardiology Office Note    Date:  01/08/2019   ID:  Timothy Mendez, Timothy Mendez [birth year], MRN [id number]  PCP:  Lajean Manes, MD  Cardiologist: Dr. Wynonia Lawman  Chief Complaint  Patient presents with  . Follow-up    seen for Dr. Wynonia Lawman, chest pain    History of Present Illness:  Timothy Mendez is a 73 y.o. male with PMH of CAD, HTN and prostate CA. he had prostatectomy many years ago.  Patient was admitted in April 2016 for evaluation of chest discomfort.  He underwent coronary CT and ultimately had cardiac catheterization by Dr. Irish Lack on 03/14/2015 which revealed EF of 60%, severe 80% lesion in the mid RCA which was treated with 3.0 x 38 mm DES postdilated to 3.5 mm, LVEDP 22 mmHg.  Patient presents today for evaluation of exertional chest pain.  This only occurs with exertion when he climbs up an incline and exercise.  It does not occur when he walk on flat ground.  This has been going on for the past 4 to 6 weeks.  We discussed cardiac catheterization versus stress test, I also discussed the case with DOD Dr. Harrell Gave, he opted to proceed with stress test first.  We will arrange treadmill nuclear stress test.  I will give him a refill for nitroglycerin, he is aware that he should not take nitroglycerin within 48 hours of sildenafil which he occasionally takes.  He will need to hold metoprolol the night before and the morning of the stress test.  N.p.o. in the morning for stress test and just in case the stress test need to be converted to a chemical stress test, no caffeine for 12 hours prior to the stress test.  I plan to see the patient back in 1 to 2 weeks.  Otherwise he has no lower extremity edema, orthopnea or PND.   Past Medical History:  Diagnosis Date  . Anginal pain ([place])   . Coronary artery disease   . GERD (gastroesophageal reflux disease)   . Hypertension   . Osteoarthritis    knees  . Prostate cancer [facility])     Past Surgical History:  Procedure Laterality Date  .  CARDIAC CATHETERIZATION  03/14/2015   Procedure: CORONARY STENT INTERVENTION;  Surgeon: Jettie Booze, MD;  Location: [facility] CATH LAB;  Service: Cardiovascular;;  Prox RCA  . CORONARY ANGIOPLASTY WITH STENT PLACEMENT  03/14/2015   "1"  . INGUINAL HERNIA REPAIR Right    as a child  . JOINT REPLACEMENT    . KNEE ARTHROSCOPY Right X 2  . LEFT HEART CATHETERIZATION WITH CORONARY ANGIOGRAM N/A 03/14/2015   Procedure: LEFT HEART CATHETERIZATION WITH CORONARY ANGIOGRAM;  Surgeon: Jettie Booze, MD;  Location: [facility] CATH LAB;  Service: Cardiovascular;  Laterality: N/A;  . PROSTATECTOMY  12/2000   Archie Endo 04/21/2011  . SHOULDER ARTHROSCOPY Right 09/2007   Archie Endo 04/07/2011  . TONSILLECTOMY    . TOTAL KNEE ARTHROPLASTY Right 07/26/2016   Procedure: RIGHT TOTAL KNEE ARTHROPLASTY;  Surgeon: Vickey Huger, MD;  Location: [place];  Service: Orthopedics;  Laterality: Right;    Current Medications: Outpatient Medications Prior to Visit  Medication Sig Dispense Refill  . acetaminophen (TYLENOL) 500 MG tablet Take 500 mg by mouth every 6 (six) hours as needed.    Marland Kitchen aspirin EC 81 MG tablet Take 81 mg by mouth daily.    Marland Kitchen atorvastatin (LIPITOR) 40 MG tablet Take 1 tablet (40 mg total) by mouth daily at 6 PM. (Patient taking differently:  Take 20 mg by mouth daily at 6 PM. ) 30 tablet 0  . celecoxib (CELEBREX) 200 MG capsule TAKE 1 CAPSULE DAILY    . Cholecalciferol (VITAMIN D PO) Take 1 tablet by mouth daily.    . metoprolol (LOPRESSOR) 100 MG tablet Take 100 mg by mouth 2 (two) times daily.    . Multiple Vitamins-Minerals (MULTIVITAMIN PO) Take 1 tablet by mouth daily.    . Omega-3 Fatty Acids (FISH OIL PO) Take 1 tablet by mouth daily.    . benzonatate (TESSALON) 100 MG capsule Take 1-2 capsules (100-200 mg total) by mouth 3 (three) times daily as needed for cough. 40 capsule 0  . chlorpheniramine-HYDROcodone (TUSSIONEX PENNKINETIC ER) 10-8 MG/5ML SUER Take 5 mLs by mouth at bedtime as needed. 60 mL 0  .  clopidogrel (PLAVIX) 75 MG tablet Take 1 tablet (75 mg total) by mouth daily with breakfast. (Patient not taking: Reported on 01/13/2017) 30 tablet 0  . Glucosamine-Chondroitin (GLUCOSAMINE CHONDR COMPLEX PO) Take 1 tablet by mouth 2 (two) times daily.     . Guaifenesin (MUCINEX MAXIMUM STRENGTH) 1200 MG TB12 Take 1 tablet (1,200 mg total) by mouth every 12 (twelve) hours as needed. 14 tablet 1  . nitroGLYCERIN (NITROSTAT) 0.4 MG SL tablet Place 0.4 mg under the tongue every 5 (five) minutes as needed for chest pain.    Marland Kitchen oseltamivir (TAMIFLU) 75 MG capsule Take 1 capsule (75 mg total) by mouth 2 (two) times daily. 10 capsule 0   No facility-administered medications prior to visit.      Allergies:   No known allergies   Social History   Socioeconomic History  . Marital status: Married    Spouse name: Not on file  . Number of children: Not on file  . Years of education: Not on file  . Highest education level: Not on file  Occupational History  . Not on file  Social Needs  . Financial resource strain: Not on file  . Food insecurity:    Worry: Not on file    Inability: Not on file  . Transportation needs:    Medical: Not on file    Non-medical: Not on file  Tobacco Use  . Smoking status: Former Smoker    Packs/day: 1.00    Years: 5.00    Pack years: 5.00    Types: Cigarettes  . Smokeless tobacco: Never Used  . Tobacco comment: "quit smoking in 1976"  Substance and Sexual Activity  . Alcohol use: Yes    Alcohol/week: 0.0 standard drinks    Comment: 03/14/2015 "Might have a drink a couple times/month"  . Drug use: No  . Sexual activity: Not on file  Lifestyle  . Physical activity:    Days per week: Not on file    Minutes per session: Not on file  . Stress: Not on file  Relationships  . Social connections:    Talks on phone: Not on file    Gets together: Not on file    Attends religious service: Not on file    Active member of club or organization: Not on file    Attends  meetings of clubs or organizations: Not on file    Relationship status: Not on file  Other Topics Concern  . Not on file  Social History Narrative  . Not on file     Family History:  The patient's family history includes Cancer in his father; Diabetes in his brother; Heart attack in his brother; Heart disease in  his mother; Lung cancer in his father.   ROS:   Please see the history of present illness.    ROS All other systems reviewed and are negative.   PHYSICAL EXAM:   VS:  BP 132/76   Pulse (!) 53   Ht 5\' 10"  (1.778 m)   Wt 202 lb 3.2 oz (91.7 kg)   BMI 29.01 kg/m    GEN: Well nourished, well developed, in no acute distress  HEENT: normal  Neck: no JVD, carotid bruits, or masses Cardiac: RRR; no murmurs, rubs, or gallops,no edema  Respiratory:  clear to auscultation bilaterally, normal work of breathing GI: soft, nontender, nondistended, + BS MS: no deformity or atrophy  Skin: warm and dry, no rash Neuro:  Alert and Oriented x 3, Strength and sensation are intact Psych: euthymic mood, full affect  Wt Readings from Last 3 Encounters:  01/05/19 202 lb 3.2 oz (91.7 kg)  01/13/17 199 lb 3.2 oz (90.4 kg)  07/26/16 192 lb (87.1 kg)      Studies/Labs Reviewed:   EKG:  EKG is ordered today.  The ekg ordered today demonstrates sinus bradycardia, heart rate of 53  Recent Labs: No results found for requested labs within last 8760 hours.   Lipid Panel    Component Value Date/Time   CHOL 137 03/14/2015 0403   TRIG 99 03/14/2015 0403   HDL 29 (L) 03/14/2015 0403   CHOLHDL 4.7 03/14/2015 0403   VLDL 20 03/14/2015 0403   LDLCALC 88 03/14/2015 0403    Additional studies/ records that were reviewed today include:   Cath 03/14/2015 IMPRESSIONS:  1.  Widely patentft main coronary artery. 2. 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 3. Mild disease in the  left circumflex artery and its branches. 4. Severe lesion in the  mid right coronary artery.  This was successfully treated with a 3.0 x 38 drug-eluting stent, postdilated to greater than 3.5 mm in diameter.  5. Normal left ventricular systolic function.  LVEDP 22 mmHg.  Ejection fraction 60%.  RECOMMENDATION:  Continue dual antiplatelet therapy for at least a year. He needs aggressive secondary prevention as well. We'll watch overnight. Likely discharge tomorrow. Stressed the importance of dual antiplatelet therapy.    ASSESSMENT:    1. Precordial pain   2. Coronary artery disease involving native coronary artery of native heart without angina pectoris   3. Essential hypertension      PLAN:  In order of problems listed above:  1. Exertional chest pain: For the past few weeks, he has been having exertional chest tightness.  Symptom is different from the previous angina, however given the exertional nature, it is quite concerning for a new blockage.  We discussed both cardiac catheterization and stress test, he opted for stress testing at this time.  I discussed the case with Dr. Harrell Gave who agreed is reasonable at this time.  We will proceed with treadmill nuclear stress test.  He will need to hold metoprolol for 12 hours prior to the stress test  2. CAD: Continue aspirin and Lipitor.  3. Hypertension: Blood pressure stable on the current metoprolol dosage.    Medication Adjustments/Labs and Tests Ordered: Current medicines are reviewed at length with the patient today.  Concerns regarding medicines are outlined above.  Medication changes, Labs and Tests ordered today are listed in the Patient Instructions below. Patient Instructions  Medication Instructions:  Your physician recommends that you continue on your current medications as  directed. Please refer to the Current Medication list given to you today.  If you need a refill on your cardiac medications before your next appointment, please call your pharmacy.    Testing/Procedures: Your  physician has requested that you have an exercise stress myoview--next Monday or Tuesday. For further information please visit [email or web address]. Please follow instruction sheet, as given.  HOLD Metoprolol the night before and the morning of your test. Do Not eat anything after midnight the night before. Do not consume products containing caffeine (regular or decaffeinated) 12 hours prior. (ex. Coffee, chocolate, sodas, tea) Wear comfortable clothing including walking shoes or tennis shoes. Do not wear cologne, perfume, aftershave, or lotions (deodorant is allowed.  Follow-Up: At [facility], you and your health needs are our priority.  As part of our continuing mission to provide you with exceptional heart care, we have created designated Provider Care Teams.  These Care Teams include your primary Cardiologist (physician) and Advanced Practice Providers (APPs -  Physician Assistants and Nurse Practitioners) who all work together to provide you with the care you need, when you need it. . Please schedule a follow-up appointment to see Almyra Deforest, PA in 1-2 weeks.  Any Other Special Instructions Will Be Listed Below (If Applicable). None      Hilbert Corrigan, [place]  01/08/2019 12:50 AM    [facility] [place], [place], [place]  [address] Phone: [phone]; Fax: [phone]

## 2019-01-08 ENCOUNTER — Encounter: Payer: Self-pay | Admitting: Physician Assistant

## 2019-01-09 ENCOUNTER — Telehealth (HOSPITAL_COMMUNITY): Payer: Self-pay

## 2019-01-09 NOTE — Telephone Encounter (Signed)
Encounter complete. 

## 2019-01-11 ENCOUNTER — Ambulatory Visit (HOSPITAL_COMMUNITY)
Admission: RE | Admit: 2019-01-11 | Discharge: 2019-01-11 | Disposition: A | Discharge status: Home / Self Care | Payer: Medicare Other | Source: Ambulatory Visit | Attending: Internal Medicine | Admitting: Internal Medicine

## 2019-01-11 DIAGNOSIS — R072 Precordial pain: Secondary | ICD-10-CM | POA: Insufficient documentation

## 2019-01-11 LAB — MYOCARDIAL PERFUSION IMAGING
CHL CUP MPHR: 147 {beats}/min
CHL RATE OF PERCEIVED EXERTION: 17
CSEPED: 8 min
Estimated workload: 10.1 METS
Exercise duration (sec): 0 s
LVDIAVOL: 121 mL (ref 62–150)
LVSYSVOL: 57 mL
Peak HR: 139 {beats}/min
Percent HR: 94 %
Rest HR: 52 {beats}/min
SDS: 1
SRS: 1
SSS: 2
TID: 1.01

## 2019-01-11 MED ORDER — TECHNETIUM TC 99M TETROFOSMIN IV KIT
10.5000 | PACK | Freq: Once | INTRAVENOUS | Status: AC | PRN
  Administered 2019-01-11: 10.5 via INTRAVENOUS
  Filled 2019-01-11: qty 11

## 2019-01-11 MED ORDER — TECHNETIUM TC 99M TETROFOSMIN IV KIT
32.7000 | PACK | Freq: Once | INTRAVENOUS | Status: AC | PRN
Start: 2019-01-11 — End: 2019-01-11
  Administered 2019-01-11: 32.7 via INTRAVENOUS
  Filled 2019-01-11: qty 33

## 2019-01-12 NOTE — Progress Notes (Signed)
The patient has been notified of the result and verbalized understanding.  All questions (if any) were answered. Jacqulynn Cadet, [place] 01/12/2019 2:08 PM

## 2019-01-26 ENCOUNTER — Ambulatory Visit: Payer: Medicare Other | Admitting: Physician Assistant

## 2019-01-29 ENCOUNTER — Ambulatory Visit: Payer: Medicare Other | Admitting: Physician Assistant

## 2019-02-07 DIAGNOSIS — R062 Wheezing: Secondary | ICD-10-CM | POA: Diagnosis not present

## 2019-02-07 DIAGNOSIS — Z1389 Encounter for screening for other disorder: Secondary | ICD-10-CM | POA: Diagnosis not present

## 2019-02-07 DIAGNOSIS — Z8546 Personal history of malignant neoplasm of prostate: Secondary | ICD-10-CM | POA: Diagnosis not present

## 2019-02-07 DIAGNOSIS — J3089 Other allergic rhinitis: Secondary | ICD-10-CM | POA: Diagnosis not present

## 2019-02-07 DIAGNOSIS — Z0000 Encounter for general adult medical examination without abnormal findings: Secondary | ICD-10-CM | POA: Diagnosis not present

## 2019-02-07 DIAGNOSIS — Z79899 Other long term (current) drug therapy: Secondary | ICD-10-CM | POA: Diagnosis not present

## 2019-02-07 DIAGNOSIS — Z1211 Encounter for screening for malignant neoplasm of colon: Secondary | ICD-10-CM | POA: Diagnosis not present

## 2019-02-07 DIAGNOSIS — E7800 Pure hypercholesterolemia, unspecified: Secondary | ICD-10-CM | POA: Diagnosis not present

## 2019-02-09 ENCOUNTER — Encounter: Payer: Self-pay | Admitting: Physician Assistant

## 2019-02-09 ENCOUNTER — Ambulatory Visit (INDEPENDENT_AMBULATORY_CARE_PROVIDER_SITE_OTHER): Payer: Medicare Other | Admitting: Physician Assistant

## 2019-02-09 VITALS — BP 129/69 | HR 56 | Ht 70.0 in | Wt 199.4 lb

## 2019-02-09 DIAGNOSIS — E785 Hyperlipidemia, unspecified: Secondary | ICD-10-CM

## 2019-02-09 DIAGNOSIS — I10 Essential (primary) hypertension: Secondary | ICD-10-CM

## 2019-02-09 DIAGNOSIS — I2510 Atherosclerotic heart disease of native coronary artery without angina pectoris: Secondary | ICD-10-CM | POA: Diagnosis not present

## 2019-02-09 NOTE — Patient Instructions (Signed)
Medication Instructions:  Your physician recommends that you continue on your current medications as directed. Please refer to the Current Medication list given to you today.  If you need a refill on your cardiac medications before your next appointment, please call your pharmacy.   Lab work: NONE   If you have labs (blood work) drawn today and your tests are completely normal, you will receive your results only by: Marland Kitchen MyChart Message (if you have MyChart) OR . A paper copy in the mail If you have any lab test that is abnormal or we need to change your treatment, we will call you to review the results.  Testing/Procedures: NONE   Follow-Up: . Your physician recommends that you KEEP your a follow-up appointment as scheduled with Dr. Martinique   Any Other Special Instructions Will Be Listed Below (If Applicable).

## 2019-02-09 NOTE — Progress Notes (Signed)
Cardiology Office Note    Date:  02/11/2019   ID:  Timothy Mendez, DOB 13-Apr-1946, MRN [id number]  PCP:  Lajean Manes, MD  Cardiologist:  Dr. Wynonia Lawman   Chief Complaint  Patient presents with  . Follow-up    previously followed by Dr. Wynonia Lawman, plan to set up with Dr. Martinique    History of Present Illness:  Timothy Mendez is a 73 y.o. male with PMH of CAD, HTN and prostate CA. He had prostatectomy many years ago.  Patient was admitted in April 2016 for evaluation of chest discomfort.  He underwent coronary CT and ultimately had cardiac catheterization by Dr. Irish Lack on 03/14/2015 which revealed EF of 60%, severe 80% lesion in the mid RCA which was treated with 3.0 x 38 mm DES postdilated to 3.5 mm, LVEDP 22 mmHg.  I last saw the patient a month ago for intermittent chest discomfort.  We discussed both stress test versus cardiac catheterization, we proceeded with a stress test first.  Myoview obtained on 01/11/2019 showed EF 53%, low risk study, there was ST depression noted during stress in the inferior lead, otherwise perfusion portion showed no evidence of ischemia or infarction.  Patient presents today for follow-up, he says he no longer have significant chest discomfort.  He is taking up swimming lessons and also stationary bicycle without any issue.  He still gets short of breath when walking up an incline however he did not think it was chest pain.  We will continue medical therapy at this time.   Past Medical History:  Diagnosis Date  . Anginal pain ([place])   . Coronary artery disease   . GERD (gastroesophageal reflux disease)   . Hypertension   . Osteoarthritis    knees  . Prostate cancer [facility])     Past Surgical History:  Procedure Laterality Date  . CARDIAC CATHETERIZATION  03/14/2015   Procedure: CORONARY STENT INTERVENTION;  Surgeon: Jettie Booze, MD;  Location: [facility] CATH LAB;  Service: Cardiovascular;;  Prox RCA  . CORONARY ANGIOPLASTY WITH STENT PLACEMENT  03/14/2015     "1"  . INGUINAL HERNIA REPAIR Right    as a child  . JOINT REPLACEMENT    . KNEE ARTHROSCOPY Right X 2  . LEFT HEART CATHETERIZATION WITH CORONARY ANGIOGRAM N/A 03/14/2015   Procedure: LEFT HEART CATHETERIZATION WITH CORONARY ANGIOGRAM;  Surgeon: Jettie Booze, MD;  Location: [facility] CATH LAB;  Service: Cardiovascular;  Laterality: N/A;  . PROSTATECTOMY  12/2000   Archie Endo 04/21/2011  . SHOULDER ARTHROSCOPY Right 09/2007   Archie Endo 04/07/2011  . TONSILLECTOMY    . TOTAL KNEE ARTHROPLASTY Right 07/26/2016   Procedure: RIGHT TOTAL KNEE ARTHROPLASTY;  Surgeon: Vickey Huger, MD;  Location: [place];  Service: Orthopedics;  Laterality: Right;    Current Medications: Outpatient Medications Prior to Visit  Medication Sig Dispense Refill  . acetaminophen (TYLENOL) 500 MG tablet Take 500 mg by mouth every 6 (six) hours as needed.    Marland Kitchen aspirin EC 81 MG tablet Take 81 mg by mouth daily.    Marland Kitchen atorvastatin (LIPITOR) 20 MG tablet Take 20 mg by mouth daily.    . celecoxib (CELEBREX) 200 MG capsule TAKE 1 CAPSULE DAILY    . Cholecalciferol (VITAMIN D PO) Take 1 tablet by mouth daily.    . metoprolol (LOPRESSOR) 100 MG tablet Take 100 mg by mouth 2 (two) times daily.    . Multiple Vitamins-Minerals (MULTIVITAMIN PO) Take 1 tablet by mouth daily.    Marland Kitchen  nitroGLYCERIN (NITROSTAT) 0.4 MG SL tablet Place 1 tablet (0.4 mg total) under the tongue every 5 (five) minutes as needed for chest pain. 25 tablet 4  . Omega-3 Fatty Acids (FISH OIL PO) Take 1 tablet by mouth daily.    . TURMERIC PO Take by mouth daily.    Marland Kitchen atorvastatin (LIPITOR) 40 MG tablet Take 1 tablet (40 mg total) by mouth daily at 6 PM. (Patient taking differently: Take 20 mg by mouth daily at 6 PM. ) 30 tablet 0   No facility-administered medications prior to visit.      Allergies:   No known allergies   Social History   Socioeconomic History  . Marital status: Married    Spouse name: Not on file  . Number of children: Not on file  . Years of  education: Not on file  . Highest education level: Not on file  Occupational History  . Not on file  Social Needs  . Financial resource strain: Not on file  . Food insecurity:    Worry: Not on file    Inability: Not on file  . Transportation needs:    Medical: Not on file    Non-medical: Not on file  Tobacco Use  . Smoking status: Former Smoker    Packs/day: 1.00    Years: 5.00    Pack years: 5.00    Types: Cigarettes  . Smokeless tobacco: Never Used  . Tobacco comment: "quit smoking in 1976"  Substance and Sexual Activity  . Alcohol use: Yes    Alcohol/week: 0.0 standard drinks    Comment: 03/14/2015 "Might have a drink a couple times/month"  . Drug use: No  . Sexual activity: Not on file  Lifestyle  . Physical activity:    Days per week: Not on file    Minutes per session: Not on file  . Stress: Not on file  Relationships  . Social connections:    Talks on phone: Not on file    Gets together: Not on file    Attends religious service: Not on file    Active member of club or organization: Not on file    Attends meetings of clubs or organizations: Not on file    Relationship status: Not on file  Other Topics Concern  . Not on file  Social History Narrative  . Not on file     Family History:  The patient's family history includes Cancer in his father; Diabetes in his brother; Heart attack in his brother; Heart disease in his mother; Lung cancer in his father.   ROS:   Please see the history of present illness.    ROS All other systems reviewed and are negative.   PHYSICAL EXAM:   VS:  BP 129/69   Pulse (!) 56   Ht 5\' 10"  (1.778 m)   Wt 199 lb 6.4 oz (90.4 kg)   BMI 28.61 kg/m    GEN: Well nourished, well developed, in no acute distress  HEENT: normal  Neck: no JVD, carotid bruits, or masses Cardiac: RRR; no murmurs, rubs, or gallops,no edema  Respiratory:  clear to auscultation bilaterally, normal work of breathing GI: soft, nontender, nondistended, +  BS MS: no deformity or atrophy  Skin: warm and dry, no rash Neuro:  Alert and Oriented x 3, Strength and sensation are intact Psych: euthymic mood, full affect  Wt Readings from Last 3 Encounters:  02/09/19 199 lb 6.4 oz (90.4 kg)  01/11/19 202 lb (91.6 kg)  01/05/19  202 lb 3.2 oz (91.7 kg)      Studies/Labs Reviewed:   EKG:  EKG is not ordered today.   Recent Labs: No results found for requested labs within last 8760 hours.   Lipid Panel    Component Value Date/Time   CHOL 137 03/14/2015 0403   TRIG 99 03/14/2015 0403   HDL 29 (L) 03/14/2015 0403   CHOLHDL 4.7 03/14/2015 0403   VLDL 20 03/14/2015 0403   LDLCALC 88 03/14/2015 0403    Additional studies/ records that were reviewed today include:   Myoview 01/11/2019 Study Highlights     The left ventricular ejection fraction is mildly decreased (45-54%).  Nuclear stress EF: 53%.  Blood pressure demonstrated a normal response to exercise.  ST segment depression was noted during stress in the II, III, aVF and V6 leads.  The study is normal.  This is a low risk study.   Normal stress nuclear study with no ischemia or infarction; EF 53 (low normal); normal wall motion.      ASSESSMENT:    1. Coronary artery disease involving native coronary artery of native heart without angina pectoris   2. Essential hypertension   3. Hyperlipidemia LDL goal <70      PLAN:  In order of problems listed above:  1. CAD: Recently had intermittent chest discomfort, Myoview was reassuring with normal perfusion.  He has since taking up swimming classes and has not noticed further chest discomfort.  We will continue medical therapy.  On aspirin and Lipitor.  2. Hypertension: Blood pressure stable on current therapy.  3. Hyperlipidemia: Continue Lipitor 20 mg daily    Medication Adjustments/Labs and Tests Ordered: Current medicines are reviewed at length with the patient today.  Concerns regarding medicines are outlined  above.  Medication changes, Labs and Tests ordered today are listed in the Patient Instructions below. Patient Instructions  Medication Instructions:  Your physician recommends that you continue on your current medications as directed. Please refer to the Current Medication list given to you today.  If you need a refill on your cardiac medications before your next appointment, please call your pharmacy.   Lab work: NONE   If you have labs (blood work) drawn today and your tests are completely normal, you will receive your results only by: Marland Kitchen MyChart Message (if you have MyChart) OR . A paper copy in the mail If you have any lab test that is abnormal or we need to change your treatment, we will call you to review the results.  Testing/Procedures: NONE   Follow-Up: . Your physician recommends that you KEEP your a follow-up appointment as scheduled with Dr. Martinique   Any Other Special Instructions Will Be Listed Below (If Applicable).       Hilbert Corrigan, [place]  02/11/2019 11:25 PM    [facility] [place], [place], [place]  [address] Phone: [phone]; Fax: [phone]

## 2019-02-11 ENCOUNTER — Encounter: Payer: Self-pay | Admitting: Physician Assistant

## 2019-02-22 DIAGNOSIS — L570 Actinic keratosis: Secondary | ICD-10-CM | POA: Diagnosis not present

## 2019-02-22 DIAGNOSIS — L812 Freckles: Secondary | ICD-10-CM | POA: Diagnosis not present

## 2019-02-22 DIAGNOSIS — D1801 Hemangioma of skin and subcutaneous tissue: Secondary | ICD-10-CM | POA: Diagnosis not present

## 2019-02-22 DIAGNOSIS — L821 Other seborrheic keratosis: Secondary | ICD-10-CM | POA: Diagnosis not present

## 2019-02-22 DIAGNOSIS — L308 Other specified dermatitis: Secondary | ICD-10-CM | POA: Diagnosis not present

## 2019-03-05 ENCOUNTER — Telehealth: Payer: Self-pay

## 2019-03-05 NOTE — Telephone Encounter (Signed)
   Primary Cardiologist:  Peter Martinique, MD   Patient contacted.  History reviewed.  No symptoms to suggest any unstable cardiac conditions.  Based on discussion, with current pandemic situation, we will be postponing this 03/08/19 appointment for Milderd Meager with a plan for f/u in 4 to 6 weeks wks or sooner if feasible/necessary.  If symptoms change, he has been instructed to contact our office.    Kathyrn Lass, LPN  6/43/5391 2:25 PM         .

## 2019-03-08 ENCOUNTER — Ambulatory Visit: Payer: Medicare Other | Admitting: Cardiology

## 2019-03-12 ENCOUNTER — Telehealth: Payer: Self-pay | Admitting: Physician Assistant

## 2019-03-12 NOTE — Telephone Encounter (Signed)
Called the patient on both his home and mobile numbers. Left a detailed message stating I was calling to get him scheduled for an E-visit with Almyra Deforest, PA-C per his earlier conversation with Fabian Sharp, PA-C.

## 2019-03-12 NOTE — Telephone Encounter (Signed)
   TELEPHONE CALL NOTE - RESCHEDULING OPV CANCELLATIONS DUE TO COVID-19  Primary Cardiologist:Peter Martinique, MD  This patient has been deemed a candidate for a follow-up tele-health visit to limit community exposure during the Covid-19 pandemic.  I have sent instructions to the patient's MyChart (dotphrase [email or web address]).   Please schedule a telehealth visit - VIDEO - with Almyra Deforest in the next 2 weeks.  I will forward this appointment request to the appropriate scheduling pool and the primary cardiologist's assist. I will remove this patient from the C19 cancellation pool.    Tami Lin Duke, PA 03/12/2019, 2:32 PM

## 2019-03-22 ENCOUNTER — Telehealth: Payer: Self-pay

## 2019-03-22 NOTE — Telephone Encounter (Signed)
New Message          Patient is calling because he don't want a video visit because he is seeing Dr. Martinique in June. I explained to the patient that we are not seeing patient in the office until August or unless the doc says something different. Pls call to advise.

## 2019-03-22 NOTE — Telephone Encounter (Signed)
Called the patient's home number and his wife [place] answered she is on the patient's DPR I informed her the reason for my phone call and she stated that she will make sure he received the message.

## 2019-05-18 DIAGNOSIS — R198 Other specified symptoms and signs involving the digestive system and abdomen: Secondary | ICD-10-CM | POA: Diagnosis not present

## 2019-05-18 DIAGNOSIS — Z86010 Personal history of colonic polyps: Secondary | ICD-10-CM | POA: Diagnosis not present

## 2019-05-18 DIAGNOSIS — R1084 Generalized abdominal pain: Secondary | ICD-10-CM | POA: Diagnosis not present

## 2019-05-18 DIAGNOSIS — R143 Flatulence: Secondary | ICD-10-CM | POA: Diagnosis not present

## 2019-05-25 ENCOUNTER — Telehealth: Payer: Self-pay | Admitting: Cardiology

## 2019-05-25 NOTE — Telephone Encounter (Signed)
LVM for patient to call and confirm his in office visit on 06-05-19.

## 2019-05-31 ENCOUNTER — Telehealth: Payer: Self-pay | Admitting: Cardiology

## 2019-05-31 NOTE — Telephone Encounter (Signed)
smartphone/ my chart/ consent/ pre reg completed  °

## 2019-06-03 NOTE — Progress Notes (Signed)
Virtual Visit via Video Note   This visit type was conducted due to national recommendations for restrictions regarding the COVID-19 Pandemic (e.g. social distancing) in an effort to limit this patient's exposure and mitigate transmission in our community.  Due to his co-morbid illnesses, this patient is at least at moderate risk for complications without adequate follow up.  This format is felt to be most appropriate for this patient at this time.  All issues noted in this document were discussed and addressed.  A limited physical exam was performed with this format.  Please refer to the patient's chart for his consent to telehealth for [facility].   Date:  06/05/2019   ID:  Timothy Mendez, DOB 03-Aug-1946, MRN [id number]  Patient Location: Home Provider Location: Office  PCP:  Lajean Manes, MD  Cardiologist:  Vola Beneke Martinique, MD  Electrophysiologist:  None   Evaluation Performed:  Follow-Up Visit  Chief Complaint:  Follow up CAD  History of Present Illness:    Timothy Mendez is a 73 y.o. male with  PMH of CAD, HTN and prostate CA s/p prostatectomy many years ago. Patient was admitted in April 2016 for evaluation of chest discomfort. He underwent coronary CT andultimatelyhadcardiac catheterization by Dr. Liam Graham 03/14/2015 which revealed EF of 60%, severe 80%lesion in the mid RCA which was treated with 3.0 x 38 mm DES postdilated to 3.5 mm, LVEDP 22 mmHg.  He was seen earlier this year by Almyra Deforest PA-C for evaluation of intermittent chest tightness. Myoview obtained on 01/11/2019 showed EF 53%, low risk study, there was ST depression noted during stress in the inferior lead, otherwise perfusion portion showed no evidence of ischemia or infarction.  When seen later for follow-up he no longer had significant chest discomfort.  On follow up today he is feeling very well. No chest pain, dyspnea, palpitations, dizziness. Keeps up with his exercise program. Notes he is having a  colonoscopy today.    The patient does not have symptoms concerning for COVID-19 infection (fever, chills, cough, or new shortness of breath).    Past Medical History:  Diagnosis Date  . Anginal pain ([place])   . Coronary artery disease   . GERD (gastroesophageal reflux disease)   . Hypertension   . Osteoarthritis    knees  . Prostate cancer [facility])    Past Surgical History:  Procedure Laterality Date  . CARDIAC CATHETERIZATION  03/14/2015   Procedure: CORONARY STENT INTERVENTION;  Surgeon: Jettie Booze, MD;  Location: [facility] CATH LAB;  Service: Cardiovascular;;  Prox RCA  . CORONARY ANGIOPLASTY WITH STENT PLACEMENT  03/14/2015   "1"  . INGUINAL HERNIA REPAIR Right    as a child  . JOINT REPLACEMENT    . KNEE ARTHROSCOPY Right X 2  . LEFT HEART CATHETERIZATION WITH CORONARY ANGIOGRAM N/A 03/14/2015   Procedure: LEFT HEART CATHETERIZATION WITH CORONARY ANGIOGRAM;  Surgeon: Jettie Booze, MD;  Location: [facility] CATH LAB;  Service: Cardiovascular;  Laterality: N/A;  . PROSTATECTOMY  12/2000   Archie Endo 04/21/2011  . SHOULDER ARTHROSCOPY Right 09/2007   Archie Endo 04/07/2011  . TONSILLECTOMY    . TOTAL KNEE ARTHROPLASTY Right 07/26/2016   Procedure: RIGHT TOTAL KNEE ARTHROPLASTY;  Surgeon: Vickey Huger, MD;  Location: [place];  Service: Orthopedics;  Laterality: Right;     Current Meds  Medication Sig  . acetaminophen (TYLENOL) 500 MG tablet Take 500 mg by mouth every 6 (six) hours as needed.  Marland Kitchen aspirin EC 81 MG tablet Take 81 mg  by mouth daily.  Marland Kitchen atorvastatin (LIPITOR) 20 MG tablet Take 1 tablet (20 mg total) by mouth daily.  . celecoxib (CELEBREX) 200 MG capsule TAKE 1 CAPSULE DAILY  . Cholecalciferol (VITAMIN D PO) Take 1 tablet by mouth daily.  . metoprolol tartrate (LOPRESSOR) 100 MG tablet Take 1 tablet (100 mg total) by mouth 2 (two) times daily.  . Multiple Vitamins-Minerals (MULTIVITAMIN PO) Take 1 tablet by mouth daily.  . nitroGLYCERIN (NITROSTAT) 0.4 MG SL tablet Place 1 tablet (0.4  mg total) under the tongue every 5 (five) minutes as needed for chest pain.  . Omega-3 Fatty Acids (FISH OIL PO) Take 1 tablet by mouth daily.  . TURMERIC PO Take by mouth daily.  . [DISCONTINUED] atorvastatin (LIPITOR) 20 MG tablet Take 20 mg by mouth daily.  . [DISCONTINUED] metoprolol (LOPRESSOR) 100 MG tablet Take 100 mg by mouth 2 (two) times daily.     Allergies:   No known allergies   Social History   Tobacco Use  . Smoking status: Former Smoker    Packs/day: 1.00    Years: 5.00    Pack years: 5.00    Types: Cigarettes  . Smokeless tobacco: Never Used  . Tobacco comment: "quit smoking in 1976"  Substance Use Topics  . Alcohol use: Yes    Alcohol/week: 0.0 standard drinks    Comment: 03/14/2015 "Might have a drink a couple times/month"  . Drug use: No     Family Hx: The patient's family history includes Cancer in his father; Diabetes in his brother; Heart attack in his brother; Heart disease in his mother; Lung cancer in his father.  ROS:   Please see the history of present illness.    All other systems reviewed and are negative.   Prior CV studies:   The following studies were reviewed today:  Myoview 01/11/19: Study Highlights    The left ventricular ejection fraction is mildly decreased (45-54%).  Nuclear stress EF: 53%.  Blood pressure demonstrated a normal response to exercise.  ST segment depression was noted during stress in the II, III, aVF and V6 leads.  The study is normal.  This is a low risk study.   Normal stress nuclear study with no ischemia or infarction; EF 53 (low normal); normal wall motion.      Labs/Other Tests and Data Reviewed:    EKG:  No ECG reviewed.  Recent Labs: No results found for requested labs within last 8760 hours.   Recent Lipid Panel Lab Results  Component Value Date/Time   CHOL 137 03/14/2015 04:03 AM   TRIG 99 03/14/2015 04:03 AM   HDL 29 (L) 03/14/2015 04:03 AM   CHOLHDL 4.7 03/14/2015 04:03 AM    LDLCALC 88 03/14/2015 04:03 AM   Dated 02/07/19: cholesterol 85, triglycerides 67, HDL 31, LDL 41. CMET and CBC normal.   Wt Readings from Last 3 Encounters:  06/05/19 187 lb (84.8 kg)  02/09/19 199 lb 6.4 oz (90.4 kg)  01/11/19 202 lb (91.6 kg)     Objective:    Vital Signs:  BP (!) 148/76   Pulse (!) 52   Temp 97.7 F (36.5 C)   Ht 5\' 10"  (1.778 m)   Wt 187 lb (84.8 kg)   SpO2 96%   BMI 26.83 kg/m    VITAL SIGNS:  reviewed  General no distress HEENT: sclera are clear Respirations unlabored.  Skin dry Neuro alert and oriented x 3 Mood normal.  ASSESSMENT & PLAN:     1.  CAD: s/p PCI of the mid RCA in 2016 with DES. Myoview in February 2020 showed no ischemia. He is asymptomatic.   We will continue medical therapy.  On aspirin, metoprolol, and Lipitor.  2. Hypertension: Blood pressure is a little high today. Will monitor. ? Related to stress of bowel prep  3. Hyperlipidemia: Continue Lipitor 20 mg daily. LDL excellent at 41.     COVID-19 Education: The signs and symptoms of COVID-19 were discussed with the patient and how to seek care for testing (follow up with PCP or arrange E-visit).  The importance of social distancing was discussed today.  Time:   Today, I have spent 10 minutes with the patient with telehealth technology discussing the above problems.     Medication Adjustments/Labs and Tests Ordered: Current medicines are reviewed at length with the patient today.  Concerns regarding medicines are outlined above.   Tests Ordered: No orders of the defined types were placed in this encounter.   Medication Changes: Meds ordered this encounter  Medications  . metoprolol tartrate (LOPRESSOR) 100 MG tablet    Sig: Take 1 tablet (100 mg total) by mouth 2 (two) times daily.    Dispense:  180 tablet    Refill:  3  . atorvastatin (LIPITOR) 20 MG tablet    Sig: Take 1 tablet (20 mg total) by mouth daily.    Dispense:  90 tablet    Refill:  3    Follow  Up:  In Person in 1 year(s)  Signed, Verle Wheeling Martinique, MD  06/05/2019 9:30 AM    [place]

## 2019-06-05 ENCOUNTER — Encounter: Payer: Self-pay | Admitting: Cardiology

## 2019-06-05 ENCOUNTER — Telehealth (INDEPENDENT_AMBULATORY_CARE_PROVIDER_SITE_OTHER): Payer: Medicare Other | Admitting: Cardiology

## 2019-06-05 VITALS — BP 148/76 | HR 52 | Temp 97.7°F | Ht 70.0 in | Wt 187.0 lb

## 2019-06-05 DIAGNOSIS — I2510 Atherosclerotic heart disease of native coronary artery without angina pectoris: Secondary | ICD-10-CM

## 2019-06-05 DIAGNOSIS — K5730 Diverticulosis of large intestine without perforation or abscess without bleeding: Secondary | ICD-10-CM | POA: Diagnosis not present

## 2019-06-05 DIAGNOSIS — D120 Benign neoplasm of cecum: Secondary | ICD-10-CM | POA: Diagnosis not present

## 2019-06-05 DIAGNOSIS — K621 Rectal polyp: Secondary | ICD-10-CM | POA: Diagnosis not present

## 2019-06-05 DIAGNOSIS — D122 Benign neoplasm of ascending colon: Secondary | ICD-10-CM | POA: Diagnosis not present

## 2019-06-05 DIAGNOSIS — I10 Essential (primary) hypertension: Secondary | ICD-10-CM | POA: Diagnosis not present

## 2019-06-05 DIAGNOSIS — D123 Benign neoplasm of transverse colon: Secondary | ICD-10-CM | POA: Diagnosis not present

## 2019-06-05 DIAGNOSIS — Z86010 Personal history of colonic polyps: Secondary | ICD-10-CM | POA: Diagnosis not present

## 2019-06-05 DIAGNOSIS — E785 Hyperlipidemia, unspecified: Secondary | ICD-10-CM | POA: Diagnosis not present

## 2019-06-05 MED ORDER — METOPROLOL TARTRATE 100 MG PO TABS: 100.0000 mg | ORAL_TABLET | Freq: Two times a day (BID) | ORAL | 3 refills | Status: DC

## 2019-06-05 MED ORDER — ATORVASTATIN CALCIUM 20 MG PO TABS: 20.0000 mg | ORAL_TABLET | Freq: Every day | ORAL | 3 refills | Status: DC

## 2019-06-05 NOTE — Patient Instructions (Signed)
Continue your current therapy  Keep an eye on your blood pressure.   Follow up in one year

## 2019-06-11 ENCOUNTER — Other Ambulatory Visit: Payer: Self-pay | Admitting: Cardiology

## 2019-06-11 MED ORDER — METOPROLOL TARTRATE 25 MG PO TABS: 25.0000 mg | ORAL_TABLET | Freq: Two times a day (BID) | ORAL | 3 refills | Status: DC

## 2019-06-12 DIAGNOSIS — K621 Rectal polyp: Secondary | ICD-10-CM | POA: Diagnosis not present

## 2019-06-12 DIAGNOSIS — D123 Benign neoplasm of transverse colon: Secondary | ICD-10-CM | POA: Diagnosis not present

## 2019-06-12 DIAGNOSIS — D122 Benign neoplasm of ascending colon: Secondary | ICD-10-CM | POA: Diagnosis not present

## 2019-06-12 DIAGNOSIS — D120 Benign neoplasm of cecum: Secondary | ICD-10-CM | POA: Diagnosis not present

## 2019-06-14 DIAGNOSIS — K4090 Unilateral inguinal hernia, without obstruction or gangrene, not specified as recurrent: Secondary | ICD-10-CM | POA: Diagnosis not present

## 2019-06-20 DIAGNOSIS — H524 Presbyopia: Secondary | ICD-10-CM | POA: Diagnosis not present

## 2019-06-20 DIAGNOSIS — H04123 Dry eye syndrome of bilateral lacrimal glands: Secondary | ICD-10-CM | POA: Diagnosis not present

## 2019-06-20 DIAGNOSIS — H52223 Regular astigmatism, bilateral: Secondary | ICD-10-CM | POA: Diagnosis not present

## 2019-06-20 DIAGNOSIS — H5203 Hypermetropia, bilateral: Secondary | ICD-10-CM | POA: Diagnosis not present

## 2019-07-02 ENCOUNTER — Ambulatory Visit: Payer: Self-pay | Admitting: Surgery

## 2019-07-02 DIAGNOSIS — K4090 Unilateral inguinal hernia, without obstruction or gangrene, not specified as recurrent: Secondary | ICD-10-CM | POA: Diagnosis not present

## 2019-07-02 NOTE — H&P (Signed)
Milderd Meager Documented: 07/02/2019 9:34 AM Location: [facility] Patient #: [id number] DOB: May 07, 1946 Married / Language: Cleophus Molt / Race: White Male  History of Present Illness Marcello Moores A. Deaven Urwin MD; 07/02/2019 1:34 PM) Patient words: Patient sent at the request of Dr. Felipa Eth for left inguinal hernia. The patient is a 4 month history of a bulge in his left groin. This pops in and pops out. It causes mild discomfort but is getting more troublesome and has to be reduced more frequently. It interferes with his quality of life. Denies any significant bowel or bladder issues. History of open prostatectomy for prostate cancer.  The patient is a 73 year old male.   Past Surgical History Nance Pew, CMA; 07/02/2019 9:39 AM) Colon Polyp Removal - Colonoscopy Knee Surgery Bilateral. Open Inguinal Hernia Surgery Right. Prostate Surgery - Removal Shoulder Surgery Right.  Diagnostic Studies History Nance Pew, [place]; 07/02/2019 9:39 AM) Colonoscopy within last year  Allergies [facility], CMA; 07/02/2019 9:41 AM) No Known Allergies [07/02/2019]: No Known Drug Allergies [07/02/2019]: Allergies Reconciled  Medication History Nance Pew, CMA; 07/02/2019 9:41 AM) Nitroglycerin (0.4MG  Tab Sublingual, Sublingual) Active. Metoprolol Tartrate (25MG  Tablet, Oral) Active. Metoprolol Tartrate (100MG  Tablet, Oral) Active. GaviLyte-N with Flavor Pack (420GM For Solution, Oral) Active. Atorvastatin Calcium (40MG  Tablet, Oral) Active. Atorvastatin Calcium (20MG  Tablet, Oral) Active. Afluria Quadrivalent (Intramuscular) Active. Medications Reconciled  Social History Nance Pew, CMA; 07/02/2019 9:39 AM) Alcohol use Occasional alcohol use. Caffeine use Carbonated beverages, Coffee. No drug use Tobacco use Former smoker.  Family History Nance Pew, [place]; 07/02/2019 9:39 AM) Cancer Father. Diabetes Mellitus Brother. Heart Disease  Mother. Respiratory Condition Father.  Other Problems Nance Pew, CMA; 07/02/2019 9:39 AM) Arthritis Inguinal Hernia Prostate Cancer     Review of Systems (Sabrina Canty CMA; 07/02/2019 9:39 AM) General Not Present- Appetite Loss, Chills, Fatigue, Fever, Night Sweats, Weight Gain and Weight Loss. Skin Not Present- Change in Wart/Mole, Dryness, Hives, Jaundice, New Lesions, Non-Healing Wounds, Rash and Ulcer. HEENT Present- Hearing Loss and Wears glasses/contact lenses. Not Present- Earache, Hoarseness, Nose Bleed, Oral Ulcers, Ringing in the Ears, Seasonal Allergies, Sinus Pain, Sore Throat, Visual Disturbances and Yellow Eyes. Respiratory Not Present- Bloody sputum, Chronic Cough, Difficulty Breathing, Snoring and Wheezing. Breast Not Present- Breast Mass, Breast Pain, Nipple Discharge and Skin Changes. Cardiovascular Not Present- Chest Pain, Difficulty Breathing Lying Down, Leg Cramps, Palpitations, Rapid Heart Rate, Shortness of Breath and Swelling of Extremities. Gastrointestinal Not Present- Abdominal Pain, Bloating, Bloody Stool, Change in Bowel Habits, Chronic diarrhea, Constipation, Difficulty Swallowing, Excessive gas, Gets full quickly at meals, Hemorrhoids, Indigestion, Nausea, Rectal Pain and Vomiting. Male Genitourinary Not Present- Blood in Urine, Change in Urinary Stream, Frequency, Impotence, Nocturia, Painful Urination, Urgency and Urine Leakage. Musculoskeletal Not Present- Back Pain, Joint Pain, Joint Stiffness, Muscle Pain, Muscle Weakness and Swelling of Extremities. Neurological Not Present- Decreased Memory, Fainting, Headaches, Numbness, Seizures, Tingling, Tremor, Trouble walking and Weakness. Psychiatric Not Present- Anxiety, Bipolar, Change in Sleep Pattern, Depression, Fearful and Frequent crying. Endocrine Not Present- Cold Intolerance, Excessive Hunger, Hair Changes, Heat Intolerance, Hot flashes and New Diabetes. Hematology Present- Blood Thinners. Not  Present- Easy Bruising, Excessive bleeding, Gland problems, HIV and Persistent Infections.  Vitals (Sabrina Canty CMA; 07/02/2019 9:42 AM) 07/02/2019 9:41 AM Weight: 193 lb Height: 70in Body Surface Area: 2.06 m Body Mass Index: 27.69 kg/m  Temp.: 98.79F(Oral)  Pulse: 95 (Regular)  BP: 154/88 (Sitting, Left Arm, Standard)        Physical Exam (Nithya Meriweather A. George Alcantar MD; 07/02/2019  1:34 PM)  General Mental Status-Alert. General Appearance-Consistent with stated age. Hydration-Well hydrated. Voice-Normal.  Head and Neck Head-normocephalic, atraumatic with no lesions or palpable masses. Trachea-midline. Thyroid Gland Characteristics - normal size and consistency.  Chest and Lung Exam Chest and lung exam reveals -quiet, even and easy respiratory effort with no use of accessory muscles and on auscultation, normal breath sounds, no adventitious sounds and normal vocal resonance. Inspection Chest Wall - Normal. Back - normal.  Cardiovascular Cardiovascular examination reveals -normal heart sounds, regular rate and rhythm with no murmurs and normal pedal pulses bilaterally.  Abdomen Note: Reducible left internal hernia. Scar in right groin from hernia repair as a child.  Neurologic Neurologic evaluation reveals -alert and oriented x 3 with no impairment of recent or remote memory. Mental Status-Normal.  Musculoskeletal Normal Exam - Left-Upper Extremity Strength Normal and Lower Extremity Strength Normal. Normal Exam - Right-Upper Extremity Strength Normal and Lower Extremity Strength Normal.    Assessment & Plan (Yoseline Andersson A. Tine Mabee MD; 07/02/2019 1:35 PM)  LEFT INGUINAL HERNIA (K40.90) Impression: Recommend repair with mesh. Discussed laparoscopic as well but he has a previous large midline incision therefore making this less of an option. The risk of hernia repair include bleeding, infection, organ injury, bowel injury, bladder injury,  nerve injury recurrent hernia, blood clots, worsening of underlying condition, chronic pain, mesh use, open surgery, death, and the need for other operattions. Pt agrees to proceed  Current Plans You are being scheduled for surgery- Our schedulers will call you.  You should hear from our office's scheduling department within 5 working days about the location, date, and time of surgery. We try to make accommodations for patient's preferences in scheduling surgery, but sometimes the OR schedule or the surgeon's schedule prevents [place] from making those accommodations.  If you have not heard from our office [phone]) in 5 working days, call the office and ask for your surgeon's nurse.  If you have other questions about your diagnosis, plan, or surgery, call the office and ask for your surgeon's nurse.  Pt Education - Pamphlet Given - Hernia Surgery: discussed with patient and provided information. Pt Education - CCS Mesh education: discussed with patient and provided information. Pt Education - Consent for inguinal hernia : discussed with patient and provided information.

## 2019-07-18 DIAGNOSIS — H6123 Impacted cerumen, bilateral: Secondary | ICD-10-CM | POA: Diagnosis not present

## 2019-07-27 DIAGNOSIS — R198 Other specified symptoms and signs involving the digestive system and abdomen: Secondary | ICD-10-CM | POA: Diagnosis not present

## 2019-07-27 DIAGNOSIS — Z86010 Personal history of colonic polyps: Secondary | ICD-10-CM | POA: Diagnosis not present

## 2019-07-27 DIAGNOSIS — K439 Ventral hernia without obstruction or gangrene: Secondary | ICD-10-CM | POA: Diagnosis not present

## 2019-08-01 ENCOUNTER — Encounter (HOSPITAL_BASED_OUTPATIENT_CLINIC_OR_DEPARTMENT_OTHER): Payer: Self-pay | Admitting: *Deleted

## 2019-08-01 ENCOUNTER — Other Ambulatory Visit: Payer: Self-pay

## 2019-08-03 ENCOUNTER — Other Ambulatory Visit: Payer: Self-pay

## 2019-08-03 ENCOUNTER — Other Ambulatory Visit (HOSPITAL_COMMUNITY)
Admission: RE | Admit: 2019-08-03 | Discharge: 2019-08-03 | Disposition: A | Discharge status: Home / Self Care | Payer: Medicare Other | Source: Ambulatory Visit | Attending: Surgery | Admitting: Surgery

## 2019-08-03 ENCOUNTER — Encounter (HOSPITAL_BASED_OUTPATIENT_CLINIC_OR_DEPARTMENT_OTHER)
Admission: RE | Admit: 2019-08-03 | Discharge: 2019-08-03 | Disposition: A | Discharge status: Home / Self Care | Payer: Medicare Other | Source: Ambulatory Visit | Attending: Surgery | Admitting: Surgery

## 2019-08-03 DIAGNOSIS — Z86010 Personal history of colonic polyps: Secondary | ICD-10-CM | POA: Diagnosis not present

## 2019-08-03 DIAGNOSIS — I2510 Atherosclerotic heart disease of native coronary artery without angina pectoris: Secondary | ICD-10-CM | POA: Diagnosis not present

## 2019-08-03 DIAGNOSIS — M1990 Unspecified osteoarthritis, unspecified site: Secondary | ICD-10-CM | POA: Diagnosis not present

## 2019-08-03 DIAGNOSIS — Z8546 Personal history of malignant neoplasm of prostate: Secondary | ICD-10-CM | POA: Diagnosis not present

## 2019-08-03 DIAGNOSIS — Z01812 Encounter for preprocedural laboratory examination: Secondary | ICD-10-CM | POA: Insufficient documentation

## 2019-08-03 DIAGNOSIS — Z79899 Other long term (current) drug therapy: Secondary | ICD-10-CM | POA: Diagnosis not present

## 2019-08-03 DIAGNOSIS — Z87891 Personal history of nicotine dependence: Secondary | ICD-10-CM | POA: Diagnosis not present

## 2019-08-03 DIAGNOSIS — Z833 Family history of diabetes mellitus: Secondary | ICD-10-CM | POA: Diagnosis not present

## 2019-08-03 DIAGNOSIS — Z809 Family history of malignant neoplasm, unspecified: Secondary | ICD-10-CM | POA: Diagnosis not present

## 2019-08-03 DIAGNOSIS — I10 Essential (primary) hypertension: Secondary | ICD-10-CM | POA: Diagnosis not present

## 2019-08-03 DIAGNOSIS — Z955 Presence of coronary angioplasty implant and graft: Secondary | ICD-10-CM | POA: Diagnosis not present

## 2019-08-03 DIAGNOSIS — Z8249 Family history of ischemic heart disease and other diseases of the circulatory system: Secondary | ICD-10-CM | POA: Diagnosis not present

## 2019-08-03 DIAGNOSIS — Z836 Family history of other diseases of the respiratory system: Secondary | ICD-10-CM | POA: Diagnosis not present

## 2019-08-03 DIAGNOSIS — K4090 Unilateral inguinal hernia, without obstruction or gangrene, not specified as recurrent: Secondary | ICD-10-CM | POA: Diagnosis not present

## 2019-08-03 DIAGNOSIS — Z20828 Contact with and (suspected) exposure to other viral communicable diseases: Secondary | ICD-10-CM | POA: Insufficient documentation

## 2019-08-03 LAB — COMPREHENSIVE METABOLIC PANEL
ALT: 22 U/L (ref 0–44)
AST: 22 U/L (ref 15–41)
Albumin: 4.3 g/dL (ref 3.5–5.0)
Alkaline Phosphatase: 67 U/L (ref 38–126)
Anion gap: 10 (ref 5–15)
BUN: 18 mg/dL (ref 8–23)
CO2: 29 mmol/L (ref 22–32)
Calcium: 9.4 mg/dL (ref 8.9–10.3)
Chloride: 101 mmol/L (ref 98–111)
Creatinine, Ser: 1.07 mg/dL (ref 0.61–1.24)
GFR calc Af Amer: >60 mL/min (ref >60)
GFR calc non Af Amer: >60 mL/min (ref >60)
Glucose, Bld: 105 mg/dL — ABNORMAL HIGH (ref 70–99)
Potassium: 4.2 mmol/L (ref 3.5–5.1)
Sodium: 140 mmol/L (ref 135–145)
Total Bilirubin: 3.2 mg/dL — ABNORMAL HIGH (ref 0.3–1.2)
Total Protein: 7 g/dL (ref 6.5–8.1)

## 2019-08-03 LAB — CBC WITH DIFFERENTIAL/PLATELET
Abs Immature Granulocytes: 0.01 10*3/uL (ref 0.00–0.07)
Basophils Absolute: 0.1 10*3/uL (ref 0.0–0.1)
Basophils Relative: 1 %
Eosinophils Absolute: 0.1 10*3/uL (ref 0.0–0.5)
Eosinophils Relative: 2 %
HCT: 43.5 % (ref 39.0–52.0)
Hemoglobin: 15.2 g/dL (ref 13.0–17.0)
Immature Granulocytes: 0 %
Lymphocytes Relative: 21 %
Lymphs Abs: 1.1 10*3/uL (ref 0.7–4.0)
MCH: 33.3 pg (ref 26.0–34.0)
MCHC: 34.9 g/dL (ref 30.0–36.0)
MCV: 95.4 fL (ref 80.0–100.0)
Monocytes Absolute: 0.4 10*3/uL (ref 0.1–1.0)
Monocytes Relative: 8 %
Neutro Abs: 3.4 10*3/uL (ref 1.7–7.7)
Neutrophils Relative %: 68 %
Platelets: 192 10*3/uL (ref 150–400)
RBC: 4.56 MIL/uL (ref 4.22–5.81)
RDW: 11.9 % (ref 11.5–15.5)
WBC: 5.1 10*3/uL (ref 4.0–10.5)
nRBC: 0 % (ref 0.0–0.2)

## 2019-08-03 LAB — SARS CORONAVIRUS 2 (TAT 6-24 HRS): SARS Coronavirus 2: NEGATIVE

## 2019-08-03 NOTE — Progress Notes (Signed)

## 2019-08-06 NOTE — Telephone Encounter (Signed)
I reviewed BP readings. He is still having elevated readings. Not ideal. He is maxed out on metoprolol. I think to achieve better BP control I would recommend adding losartan 25 mg daily. Repeat BMET in 2 weeks.  Peter Martinique MD, [facility]

## 2019-08-07 ENCOUNTER — Ambulatory Visit (HOSPITAL_BASED_OUTPATIENT_CLINIC_OR_DEPARTMENT_OTHER): Payer: Medicare Other | Admitting: Certified Registered"

## 2019-08-07 ENCOUNTER — Encounter (HOSPITAL_BASED_OUTPATIENT_CLINIC_OR_DEPARTMENT_OTHER)
Admission: RE | Disposition: A | Discharge status: Home / Self Care | Payer: Self-pay | Source: Home / Self Care | Attending: Surgery

## 2019-08-07 ENCOUNTER — Other Ambulatory Visit: Payer: Self-pay

## 2019-08-07 ENCOUNTER — Encounter (HOSPITAL_BASED_OUTPATIENT_CLINIC_OR_DEPARTMENT_OTHER): Payer: Self-pay

## 2019-08-07 ENCOUNTER — Ambulatory Visit (HOSPITAL_BASED_OUTPATIENT_CLINIC_OR_DEPARTMENT_OTHER)
Admission: RE | Admit: 2019-08-07 | Discharge: 2019-08-07 | Disposition: A | Discharge status: Home / Self Care | Payer: Medicare Other | Attending: Surgery | Admitting: Surgery

## 2019-08-07 DIAGNOSIS — Z836 Family history of other diseases of the respiratory system: Secondary | ICD-10-CM | POA: Insufficient documentation

## 2019-08-07 DIAGNOSIS — Z8546 Personal history of malignant neoplasm of prostate: Secondary | ICD-10-CM | POA: Diagnosis not present

## 2019-08-07 DIAGNOSIS — Z8249 Family history of ischemic heart disease and other diseases of the circulatory system: Secondary | ICD-10-CM | POA: Insufficient documentation

## 2019-08-07 DIAGNOSIS — Z833 Family history of diabetes mellitus: Secondary | ICD-10-CM | POA: Diagnosis not present

## 2019-08-07 DIAGNOSIS — Z79899 Other long term (current) drug therapy: Secondary | ICD-10-CM | POA: Insufficient documentation

## 2019-08-07 DIAGNOSIS — Z86010 Personal history of colonic polyps: Secondary | ICD-10-CM | POA: Insufficient documentation

## 2019-08-07 DIAGNOSIS — I25119 Atherosclerotic heart disease of native coronary artery with unspecified angina pectoris: Secondary | ICD-10-CM | POA: Diagnosis not present

## 2019-08-07 DIAGNOSIS — K4090 Unilateral inguinal hernia, without obstruction or gangrene, not specified as recurrent: Secondary | ICD-10-CM | POA: Insufficient documentation

## 2019-08-07 DIAGNOSIS — M1990 Unspecified osteoarthritis, unspecified site: Secondary | ICD-10-CM | POA: Diagnosis not present

## 2019-08-07 DIAGNOSIS — Z87891 Personal history of nicotine dependence: Secondary | ICD-10-CM | POA: Diagnosis not present

## 2019-08-07 DIAGNOSIS — I10 Essential (primary) hypertension: Secondary | ICD-10-CM | POA: Diagnosis not present

## 2019-08-07 DIAGNOSIS — I2510 Atherosclerotic heart disease of native coronary artery without angina pectoris: Secondary | ICD-10-CM | POA: Insufficient documentation

## 2019-08-07 DIAGNOSIS — Z809 Family history of malignant neoplasm, unspecified: Secondary | ICD-10-CM | POA: Diagnosis not present

## 2019-08-07 DIAGNOSIS — Z955 Presence of coronary angioplasty implant and graft: Secondary | ICD-10-CM | POA: Insufficient documentation

## 2019-08-07 DIAGNOSIS — G8918 Other acute postprocedural pain: Secondary | ICD-10-CM | POA: Diagnosis not present

## 2019-08-07 HISTORY — DX: Unilateral inguinal hernia, without obstruction or gangrene, not specified as recurrent: K40.90

## 2019-08-07 HISTORY — PX: INGUINAL HERNIA REPAIR: SHX194

## 2019-08-07 SURGERY — REPAIR, HERNIA, INGUINAL, ADULT
Anesthesia: General | Site: Abdomen | Laterality: Left

## 2019-08-07 MED ORDER — DEXAMETHASONE SODIUM PHOSPHATE 10 MG/ML IJ SOLN
INTRAMUSCULAR | Status: DC | PRN
  Administered 2019-08-07: 4 mg via INTRAVENOUS

## 2019-08-07 MED ORDER — ONDANSETRON HCL 4 MG/2ML IJ SOLN
INTRAMUSCULAR | Status: DC | PRN
  Administered 2019-08-07: 4 mg via INTRAVENOUS

## 2019-08-07 MED ORDER — GABAPENTIN 300 MG PO CAPS
ORAL_CAPSULE | ORAL | Status: AC
  Filled 2019-08-07: qty 1

## 2019-08-07 MED ORDER — LACTATED RINGERS IV SOLN
INTRAVENOUS | Status: DC
  Administered 2019-08-07: 16:00:00 via INTRAVENOUS

## 2019-08-07 MED ORDER — LIDOCAINE HCL (CARDIAC) PF 100 MG/5ML IV SOSY: PREFILLED_SYRINGE | INTRAVENOUS | Status: DC | PRN

## 2019-08-07 MED ORDER — EPHEDRINE 5 MG/ML INJ
INTRAVENOUS | Status: AC
  Filled 2019-08-07: qty 20

## 2019-08-07 MED ORDER — EPHEDRINE SULFATE 50 MG/ML IJ SOLN
INTRAMUSCULAR | Status: DC | PRN
  Administered 2019-08-07 (×3): 5 mg via INTRAVENOUS
  Administered 2019-08-07: 10 mg via INTRAVENOUS

## 2019-08-07 MED ORDER — ONDANSETRON HCL 4 MG/2ML IJ SOLN
INTRAMUSCULAR | Status: AC
  Filled 2019-08-07: qty 2

## 2019-08-07 MED ORDER — BUPIVACAINE LIPOSOME 1.3 % IJ SUSP
INTRAMUSCULAR | Status: DC | PRN
  Administered 2019-08-07: 10 mL via PERINEURAL

## 2019-08-07 MED ORDER — FENTANYL CITRATE (PF) 100 MCG/2ML IJ SOLN
50.0000 ug | INTRAMUSCULAR | Status: DC | PRN
  Administered 2019-08-07: 50 ug via INTRAVENOUS
  Administered 2019-08-07: 14:00:00 100 ug via INTRAVENOUS

## 2019-08-07 MED ORDER — OXYCODONE HCL 5 MG PO TABS: 5.0000 mg | ORAL_TABLET | Freq: Four times a day (QID) | ORAL | 0 refills | Status: DC | PRN

## 2019-08-07 MED ORDER — CEFAZOLIN SODIUM-DEXTROSE 2-4 GM/100ML-% IV SOLN
INTRAVENOUS | Status: AC
  Filled 2019-08-07: qty 100

## 2019-08-07 MED ORDER — FENTANYL CITRATE (PF) 100 MCG/2ML IJ SOLN: 25.0000 ug | INTRAMUSCULAR | Status: DC | PRN

## 2019-08-07 MED ORDER — MIDAZOLAM HCL 2 MG/2ML IJ SOLN
INTRAMUSCULAR | Status: AC
  Filled 2019-08-07: qty 2

## 2019-08-07 MED ORDER — SCOPOLAMINE 1 MG/3DAYS TD PT72: 1.0000 | MEDICATED_PATCH | Freq: Once | TRANSDERMAL | Status: DC

## 2019-08-07 MED ORDER — FENTANYL CITRATE (PF) 100 MCG/2ML IJ SOLN
INTRAMUSCULAR | Status: AC
  Filled 2019-08-07: qty 2

## 2019-08-07 MED ORDER — DEXTROSE 5 % IV SOLN
3.0000 g | INTRAVENOUS | Status: AC
  Administered 2019-08-07: 2 g via INTRAVENOUS

## 2019-08-07 MED ORDER — CHLORHEXIDINE GLUCONATE CLOTH 2 % EX PADS: 6.0000 | MEDICATED_PAD | Freq: Once | CUTANEOUS | Status: DC

## 2019-08-07 MED ORDER — PROPOFOL 10 MG/ML IV BOLUS
INTRAVENOUS | Status: AC
  Filled 2019-08-07: qty 20

## 2019-08-07 MED ORDER — CELECOXIB 200 MG PO CAPS
200.0000 mg | ORAL_CAPSULE | ORAL | Status: AC
  Administered 2019-08-07: 200 mg via ORAL

## 2019-08-07 MED ORDER — IBUPROFEN 800 MG PO TABS: 800.0000 mg | ORAL_TABLET | Freq: Three times a day (TID) | ORAL | 0 refills | Status: AC | PRN

## 2019-08-07 MED ORDER — DEXAMETHASONE SODIUM PHOSPHATE 10 MG/ML IJ SOLN
INTRAMUSCULAR | Status: AC
  Filled 2019-08-07: qty 1

## 2019-08-07 MED ORDER — CELECOXIB 200 MG PO CAPS
ORAL_CAPSULE | ORAL | Status: AC
  Filled 2019-08-07: qty 1

## 2019-08-07 MED ORDER — PROPOFOL 10 MG/ML IV BOLUS
INTRAVENOUS | Status: DC | PRN
  Administered 2019-08-07: 170 mg via INTRAVENOUS

## 2019-08-07 MED ORDER — MEPERIDINE HCL 25 MG/ML IJ SOLN: 6.2500 mg | INTRAMUSCULAR | Status: DC | PRN

## 2019-08-07 MED ORDER — PHENYLEPHRINE 40 MCG/ML (10ML) SYRINGE FOR IV PUSH (FOR BLOOD PRESSURE SUPPORT)
PREFILLED_SYRINGE | INTRAVENOUS | Status: AC
  Filled 2019-08-07: qty 20

## 2019-08-07 MED ORDER — LACTATED RINGERS IV SOLN
INTRAVENOUS | Status: DC
  Administered 2019-08-07: 13:00:00 via INTRAVENOUS

## 2019-08-07 MED ORDER — GABAPENTIN 300 MG PO CAPS
300.0000 mg | ORAL_CAPSULE | ORAL | Status: AC
  Administered 2019-08-07: 13:00:00 300 mg via ORAL

## 2019-08-07 MED ORDER — METOCLOPRAMIDE HCL 5 MG/ML IJ SOLN: 10.0000 mg | Freq: Once | INTRAMUSCULAR | Status: DC | PRN

## 2019-08-07 MED ORDER — BUPIVACAINE HCL (PF) 0.5 % IJ SOLN
INTRAMUSCULAR | Status: DC | PRN
  Administered 2019-08-07: 15 mL via PERINEURAL

## 2019-08-07 MED ORDER — MIDAZOLAM HCL 2 MG/2ML IJ SOLN: 1.0000 mg | INTRAMUSCULAR | Status: DC | PRN

## 2019-08-07 SURGICAL SUPPLY — 55 items
ADH SKN CLS APL DERMABOND .7 (GAUZE/BANDAGES/DRESSINGS) ×1
APL PRP STRL LF DISP 70% ISPRP (MISCELLANEOUS) ×1
BLADE CLIPPER SURG (BLADE) ×2 IMPLANT
BLADE SURG 15 STRL LF DISP TIS (BLADE) ×1 IMPLANT
BLADE SURG 15 STRL SS (BLADE) ×3
CANISTER SUCT 1200ML W/VALVE (MISCELLANEOUS) IMPLANT
CHLORAPREP W/TINT 26 (MISCELLANEOUS) ×3 IMPLANT
CLOSURE WOUND 1/2 X4 (GAUZE/BANDAGES/DRESSINGS)
COVER BACK TABLE REUSABLE LG (DRAPES) ×3 IMPLANT
COVER MAYO STAND REUSABLE (DRAPES) ×3 IMPLANT
COVER WAND RF STERILE (DRAPES) IMPLANT
DECANTER SPIKE VIAL GLASS SM (MISCELLANEOUS) IMPLANT
DERMABOND ADVANCED (GAUZE/BANDAGES/DRESSINGS) ×2
DERMABOND ADVANCED .7 DNX12 (GAUZE/BANDAGES/DRESSINGS) ×1 IMPLANT
DRAIN PENROSE 1/2X12 LTX STRL (WOUND CARE) ×3 IMPLANT
DRAPE LAPAROTOMY TRNSV 102X78 (DRAPES) ×3 IMPLANT
DRAPE UTILITY XL STRL (DRAPES) ×3 IMPLANT
ELECT COATED BLADE 2.86 ST (ELECTRODE) ×3 IMPLANT
ELECT REM PT RETURN 9FT ADLT (ELECTROSURGICAL) ×3
ELECTRODE REM PT RTRN 9FT ADLT (ELECTROSURGICAL) ×1 IMPLANT
GAUZE 4X4 16PLY RFD (DISPOSABLE) IMPLANT
GAUZE SPONGE 4X4 12PLY STRL LF (GAUZE/BANDAGES/DRESSINGS) IMPLANT
GLOVE BIO SURGEON STRL SZ 6.5 (GLOVE) ×1 IMPLANT
GLOVE BIO SURGEONS STRL SZ 6.5 (GLOVE) ×1
GLOVE BIOGEL PI IND STRL 7.0 (GLOVE) IMPLANT
GLOVE BIOGEL PI IND STRL 8 (GLOVE) ×1 IMPLANT
GLOVE BIOGEL PI INDICATOR 7.0 (GLOVE) ×4
GLOVE BIOGEL PI INDICATOR 8 (GLOVE) ×2
GLOVE ECLIPSE 8.0 STRL XLNG CF (GLOVE) ×3 IMPLANT
GOWN STRL REUS W/ TWL LRG LVL3 (GOWN DISPOSABLE) ×2 IMPLANT
GOWN STRL REUS W/TWL LRG LVL3 (GOWN DISPOSABLE) ×6
MESH HERNIA SYS ULTRAPRO LRG (Mesh General) ×2 IMPLANT
NDL HYPO 25X1 1.5 SAFETY (NEEDLE) ×1 IMPLANT
NEEDLE HYPO 25X1 1.5 SAFETY (NEEDLE) ×3 IMPLANT
NS IRRIG 1000ML POUR BTL (IV SOLUTION) IMPLANT
PACK BASIN DAY SURGERY FS (CUSTOM PROCEDURE TRAY) ×3 IMPLANT
PENCIL BUTTON HOLSTER BLD 10FT (ELECTRODE) ×3 IMPLANT
SLEEVE SCD COMPRESS KNEE MED (MISCELLANEOUS) ×3 IMPLANT
SPONGE LAP 4X18 RFD (DISPOSABLE) ×3 IMPLANT
STRIP CLOSURE SKIN 1/2X4 (GAUZE/BANDAGES/DRESSINGS) IMPLANT
SUT MON AB 4-0 PC3 18 (SUTURE) ×3 IMPLANT
SUT NOVA 0 T19/GS 22DT (SUTURE) IMPLANT
SUT NOVA NAB DX-16 0-1 5-0 T12 (SUTURE) ×8 IMPLANT
SUT VIC AB 2-0 SH 27 (SUTURE) ×3
SUT VIC AB 2-0 SH 27XBRD (SUTURE) ×1 IMPLANT
SUT VIC AB 3-0 54X BRD REEL (SUTURE) IMPLANT
SUT VIC AB 3-0 BRD 54 (SUTURE)
SUT VICRYL 3-0 CR8 SH (SUTURE) ×3 IMPLANT
SUT VICRYL AB 2 0 TIE (SUTURE) IMPLANT
SUT VICRYL AB 2 0 TIES (SUTURE)
SYR CONTROL 10ML LL (SYRINGE) ×3 IMPLANT
TOWEL GREEN STERILE FF (TOWEL DISPOSABLE) ×3 IMPLANT
TUBE CONNECTING 20'X1/4 (TUBING)
TUBE CONNECTING 20X1/4 (TUBING) IMPLANT
YANKAUER SUCT BULB TIP NO VENT (SUCTIONS) IMPLANT

## 2019-08-07 NOTE — Anesthesia Procedure Notes (Signed)
Anesthesia Regional Block: TAP block   Pre-Anesthetic Checklist: ,, timeout performed, Correct Patient, Correct Site, Correct Laterality, Correct Procedure, Correct Position, site marked, Risks and benefits discussed,  Surgical consent,  Pre-op evaluation,  At surgeon's request and post-op pain management  Laterality: Left  Prep: Maximum Sterile Barrier Precautions used, chloraprep       Needles:  Injection technique: Single-shot  Needle Type: Echogenic Stimulator Needle     Needle Length: 10cm      Additional Needles:   Procedures:,,,, ultrasound used (permanent image in chart),,,,  Narrative:  Start time: 08/07/2019 1:25 PM End time: 08/07/2019 1:32 PM Injection made incrementally with aspirations every 5 mL.  Performed by: Personally  Anesthesiologist: Montez Hageman, MD  Additional Notes: Risks, benefits and alternative to block explained extensively.  Patient tolerated procedure well, without complications.

## 2019-08-07 NOTE — Discharge Instructions (Signed)
[facility] _______[facility]  UMBILICAL OR INGUINAL HERNIA REPAIR: POST OP INSTRUCTIONS  Always review your discharge instruction sheet given to you by the facility where your surgery was performed. IF YOU HAVE DISABILITY OR FAMILY LEAVE FORMS, YOU MUST BRING THEM TO THE OFFICE FOR PROCESSING.   DO NOT GIVE THEM TO YOUR DOCTOR.  1. A  prescription for pain medication may be given to you upon discharge.  Take your pain medication as prescribed, if needed.  If narcotic pain medicine is not needed, then you may take acetaminophen (Tylenol) or ibuprofen (Advil) as needed. 2. Take your usually prescribed medications unless otherwise directed. If you need a refill on your pain medication, please contact your pharmacy.  They will contact our office to request authorization. Prescriptions will not be filled after 5 pm or on week-ends. 3. You should follow a light diet the first 24 hours after arrival home, such as soup and crackers, etc.  Be sure to include lots of fluids daily.  Resume your normal diet the day after surgery. 4.Most patients will experience some swelling and bruising around the umbilicus or in the groin and scrotum.  Ice packs and reclining will help.  Swelling and bruising can take several days to resolve.  6. It is common to experience some constipation if taking pain medication after surgery.  Increasing fluid intake and taking a stool softener (such as Colace) will usually help or prevent this problem from occurring.  A mild laxative (Milk of Magnesia or Miralax) should be taken according to package directions if there are no bowel movements after 48 hours. 7. Unless discharge instructions indicate otherwise, you may remove your bandages 24-48 hours after surgery, and you may shower at that time.  You may have steri-strips (small skin tapes) in place directly over the incision.  These strips should be left on the skin for 7-10 days.  If your surgeon used skin glue on the  incision, you may shower in 24 hours.  The glue will flake off over the next 2-3 weeks.  Any sutures or staples will be removed at the office during your follow-up visit. 8. ACTIVITIES:  You may resume regular (light) daily activities beginning the next day--such as daily self-care, walking, climbing stairs--gradually increasing activities as tolerated.  You may have sexual intercourse when it is comfortable.  Refrain from any heavy lifting or straining until approved by your doctor.  a.You may drive when you are no longer taking prescription pain medication, you can comfortably wear a seatbelt, and you can safely maneuver your car and apply brakes. b.RETURN TO WORK:   _____________________________________________  9.You should see your doctor in the office for a follow-up appointment approximately 2-3 weeks after your surgery.  Make sure that you call for this appointment within a day or two after you arrive home to insure a convenient appointment time. 10.OTHER INSTRUCTIONS: _________________________    _____________________________________  WHEN TO CALL YOUR DOCTOR: 1. Fever over 101.0 2. Inability to urinate 3. Nausea and/or vomiting 4. Extreme swelling or bruising 5. Continued bleeding from incision. 6. Increased pain, redness, or drainage from the incision  The clinic staff is available to answer your questions during regular business hours.  Please dont hesitate to call and ask to speak to one of the nurses for clinical concerns.  If you have a medical emergency, go to the nearest emergency room or call 911.  A surgeon from [facility] is always on call at the hospital  No  Ibuprofen until after 9pm.   [address], [place], [place], [place]  [address] ?  [address], [place], [place]   [address] [phone] ? [phone] ? FAX [phone] Web site: [email or web address]   Post Anesthesia Home Care Instructions  Activity: Get plenty of rest  for the remainder of the day. A responsible individual must stay with you for 24 hours following the procedure.  For the next 24 hours, DO NOT: -Drive a car -Paediatric nurse -Drink alcoholic beverages -Take any medication unless instructed by your physician -Make any legal decisions or sign important papers.  Meals: Start with liquid foods such as gelatin or soup. Progress to regular foods as tolerated. Avoid greasy, spicy, heavy foods. If nausea and/or vomiting occur, drink only clear liquids until the nausea and/or vomiting subsides. Call your physician if vomiting continues.  Special Instructions/Symptoms: Your throat may feel dry or sore from the anesthesia or the breathing tube placed in your throat during surgery. If this causes discomfort, gargle with warm salt water. The discomfort should disappear within 24 hours.  If you had a scopolamine patch placed behind your ear for the management of post- operative nausea and/or vomiting:  1. The medication in the patch is effective for 72 hours, after which it should be removed.  Wrap patch in a tissue and discard in the trash. Wash hands thoroughly with soap and water. 2. You may remove the patch earlier than 72 hours if you experience unpleasant side effects which may include dry mouth, dizziness or visual disturbances. 3. Avoid touching the patch. Wash your hands with soap and water after contact with the patch.  Information for Discharge Teaching: EXPAREL (bupivacaine liposome injectable suspension)   Your surgeon or anesthesiologist gave you EXPAREL(bupivacaine) to help control your pain after surgery.   EXPAREL is a local anesthetic that provides pain relief by numbing the tissue around the surgical site.  EXPAREL is designed to release pain medication over time and can control pain for up to 72 hours.  Depending on how you respond to EXPAREL, you may require less pain medication during your recovery.  Possible side  effects:  Temporary loss of sensation or ability to move in the area where bupivacaine was injected.  Nausea, vomiting, constipation  Rarely, numbness and tingling in your mouth or lips, lightheadedness, or anxiety may occur.  Call your doctor right away if you think you may be experiencing any of these sensations, or if you have other questions regarding possible side effects.  Follow all other discharge instructions given to you by your surgeon or nurse. Eat a healthy diet and drink plenty of water or other fluids.  If you return to the hospital for any reason within 96 hours following the administration of EXPAREL, it is important for health care providers to know that you have received this anesthetic. A teal colored band has been placed on your arm with the date, time and amount of EXPAREL you have received in order to alert and inform your health care providers. Please leave this armband in place for the full 96 hours following administration, and then you may remove the band.

## 2019-08-07 NOTE — Op Note (Signed)
Left inguinal hernia, Open, Procedure Note with mesh  Indications: The patient presented with a history of a left, reducible inguinal hernia.  The risk of hernia repair include bleeding,  Infection,   Recurrence of the hernia,  Mesh use, chronic pain,  Organ injury,  Bowel injury,  Bladder injury,   nerve injury with numbness around the incision,  Death,  and worsening of preexisting  medical problems.  The alternatives to surgery have been discussed as well..  Long term expectations of both operative and non operative treatments have been discussed.   The patient agrees to proceed.  Pre-operative Diagnosis: left reducible inguinal hernia  Post-operative Diagnosis: same  Surgeon: Turner Daniels MD  Assistants: none  Anesthesia: General endotracheal anesthesia and TAP block   ASA Class: 3  Procedure Details  The patient was seen again in the Holding Room. The risks, benefits, complications, treatment options, and expected outcomes were discussed with the patient. The possibilities of reaction to medication, pulmonary aspiration, perforation of viscus, bleeding, recurrent infection, the need for additional procedures, and development of a complication requiring transfusion or further operation were discussed with the patient and/or family. There was concurrence with the proposed plan, and informed consent was obtained. The site of surgery was properly noted/marked. The patient was taken to the Operating Room, identified as Timothy Mendez, and the procedure verified as hernia repair. A Time Out was held and the above information confirmed.  The patient was placed in the supine position and underwent induction of anesthesia, the lower abdomen and groin was prepped and draped in the standard fashion. l. A transverse incision was made. Dissection was carried through the soft tissue to expose the inguinal canal and inguinal ligament along its lower edge. The external oblique fascia was split along  the course of its fibers, exposing the inguinal canal. The cord and nerve were looped using a Penrose drain and reflected out of the field. The defect was exposed and a piece of prolene hernia system ultrapro mesh was and placed into  the indirect defect. Interupted 1-0 novafil suture was then used  to repair the defect, with the suture being sewn from the pubic tubercle inferiorly and superiorly along the canal to a level just beyond the internal ring. The mesh was split to allow passage of the cord and nerve into the canal without entrapment. The contents were then returned to canal and the external oblique fashion was then closed in a continuous fashion using 3-0 Vicryl suture taking care not to cause entrapment. Scarpa's layer closed with 3 0 vicryl and 4 0 monocryl used to close the skin.  Dermabond used for dressing.  Instrument, sponge, and needle counts were correct prior to closure and at the conclusion of the case.  Findings: Hernia as above  Estimated Blood Loss: Minimal         Drains: None         Total IV Fluids: per record          Specimens: none                Complications: None; patient tolerated the procedure well.         Disposition: PACU - hemodynamically stable.         Condition: stable

## 2019-08-07 NOTE — Anesthesia Preprocedure Evaluation (Signed)
Anesthesia Evaluation  Patient identified by MRN, date of birth, ID band Patient awake    Reviewed: Allergy & Precautions, NPO status , Patient's Chart, lab work & pertinent test results  Airway Mallampati: II  TM Distance: >3 FB Neck ROM: Full    Dental no notable dental hx.    Pulmonary neg pulmonary ROS, former smoker,    Pulmonary exam normal breath sounds clear to auscultation       Cardiovascular hypertension, Pt. on medications + CAD and + Cardiac Stents (2016)  Normal cardiovascular exam Rhythm:Regular Rate:Normal     Neuro/Psych negative neurological ROS  negative psych ROS   GI/Hepatic negative GI ROS, Neg liver ROS,   Endo/Other  negative endocrine ROS  Renal/GU negative Renal ROS  negative genitourinary   Musculoskeletal negative musculoskeletal ROS (+)   Abdominal   Peds negative pediatric ROS (+)  Hematology negative hematology ROS (+)   Anesthesia Other Findings   Reproductive/Obstetrics negative OB ROS                             Anesthesia Physical Anesthesia Plan  ASA: II  Anesthesia Plan: General   Post-op Pain Management:  Regional for Post-op pain   Induction: Intravenous  PONV Risk Score and Plan: 2 and Ondansetron, Dexamethasone and Treatment may vary due to age or medical condition  Airway Management Planned: LMA and Oral ETT  Additional Equipment:   Intra-op Plan:   Post-operative Plan: Extubation in OR  Informed Consent: I have reviewed the patients History and Physical, chart, labs and discussed the procedure including the risks, benefits and alternatives for the proposed anesthesia with the patient or authorized representative who has indicated his/her understanding and acceptance.     Dental advisory given  Plan Discussed with: CRNA  Anesthesia Plan Comments:         Anesthesia Quick Evaluation

## 2019-08-07 NOTE — Anesthesia Procedure Notes (Signed)
Procedure Name: LMA Insertion Date/Time: 08/07/2019 2:11 PM Performed by: Lavonia Dana, CRNA Pre-anesthesia Checklist: Patient identified, Emergency Drugs available, Suction available and Patient being monitored Patient Re-evaluated:Patient Re-evaluated prior to induction Oxygen Delivery Method: Circle system utilized Preoxygenation: Pre-oxygenation with 100% oxygen Induction Type: IV induction Ventilation: Mask ventilation without difficulty LMA: LMA inserted LMA Size: 4.0 Number of attempts: 1 Airway Equipment and Method: Bite block Placement Confirmation: positive ETCO2 Tube secured with: Tape Dental Injury: Teeth and Oropharynx as per pre-operative assessment

## 2019-08-07 NOTE — Interval H&P Note (Signed)
History and Physical Interval Note:  08/07/2019 1:52 PM  Timothy Mendez  has presented today for surgery, with the diagnosis of LEFT INGUINAL HERNIA.  The various methods of treatment have been discussed with the patient and family. After consideration of risks, benefits and other options for treatment, the patient has consented to  Procedure(s): LEFT INGUINAL HERNIA REPAIR WITH MESH (Left) as a surgical intervention.  The patient's history has been reviewed, patient examined, no change in status, stable for surgery.  I have reviewed the patient's chart and labs.  Questions were answered to the patient's satisfaction.     [place]

## 2019-08-07 NOTE — H&P (Signed)
Milderd Meager  Location: [facility] Patient #: [id number] DOB: 1946-10-27 Married / Language: Cleophus Molt / Race: White Male  History of Present Illness Marcello Moores A. Tamy Accardo MD; 07/02/2019 1:34 PM) Patient words: Patient sent at the request of Dr. Felipa Eth for left inguinal hernia. The patient is a 4 month history of a bulge in his left groin. This pops in and pops out. It causes mild discomfort but is getting more troublesome and has to be reduced more frequently. It interferes with his quality of life. Denies any significant bowel or bladder issues. History of open prostatectomy for prostate cancer.  The patient is a 73 year old male.   Past Surgical History  Colon Polyp Removal - Colonoscopy Knee Surgery Bilateral. Open Inguinal Hernia Surgery Right. Prostate Surgery - Removal Shoulder Surgery Right.  Diagnostic Studies History  Colonoscopy within last year  Allergies  No Known Allergies No Known Drug Allergies  Allergies Reconciled  Medication History  Nitroglycerin (0.4MG  Tab Sublingual, Sublingual) Active. Metoprolol Tartrate (25MG  Tablet, Oral) Active. Metoprolol Tartrate (100MG  Tablet, Oral) Active. GaviLyte-N with Flavor Pack (420GM For Solution, Oral) Active. Atorvastatin Calcium (40MG  Tablet, Oral) Active. Atorvastatin Calcium (20MG  Tablet, Oral) Active. Afluria Quadrivalent (Intramuscular) Active. Medications Reconciled  Social History  Alcohol use Occasional alcohol use. Caffeine use Carbonated beverages, Coffee. No drug use Tobacco use Former smoker.  Family History  Cancer Father. Diabetes Mellitus Brother. Heart Disease Mother. Respiratory Condition Father.  Other Problems  Arthritis Inguinal Hernia Prostate Cancer     Review of Systems  General Not Present- Appetite Loss, Chills, Fatigue, Fever, Night Sweats, Weight Gain and Weight Loss. Skin Not Present- Change in Wart/Mole, Dryness,  Hives, Jaundice, New Lesions, Non-Healing Wounds, Rash and Ulcer. HEENT Present- Hearing Loss and Wears glasses/contact lenses. Not Present- Earache, Hoarseness, Nose Bleed, Oral Ulcers, Ringing in the Ears, Seasonal Allergies, Sinus Pain, Sore Throat, Visual Disturbances and Yellow Eyes. Respiratory Not Present- Bloody sputum, Chronic Cough, Difficulty Breathing, Snoring and Wheezing. Breast Not Present- Breast Mass, Breast Pain, Nipple Discharge and Skin Changes. Cardiovascular Not Present- Chest Pain, Difficulty Breathing Lying Down, Leg Cramps, Palpitations, Rapid Heart Rate, Shortness of Breath and Swelling of Extremities. Gastrointestinal Not Present- Abdominal Pain, Bloating, Bloody Stool, Change in Bowel Habits, Chronic diarrhea, Constipation, Difficulty Swallowing, Excessive gas, Gets full quickly at meals, Hemorrhoids, Indigestion, Nausea, Rectal Pain and Vomiting. Male Genitourinary Not Present- Blood in Urine, Change in Urinary Stream, Frequency, Impotence, Nocturia, Painful Urination, Urgency and Urine Leakage. Musculoskeletal Not Present- Back Pain, Joint Pain, Joint Stiffness, Muscle Pain, Muscle Weakness and Swelling of Extremities. Neurological Not Present- Decreased Memory, Fainting, Headaches, Numbness, Seizures, Tingling, Tremor, Trouble walking and Weakness. Psychiatric Not Present- Anxiety, Bipolar, Change in Sleep Pattern, Depression, Fearful and Frequent crying. Endocrine Not Present- Cold Intolerance, Excessive Hunger, Hair Changes, Heat Intolerance, Hot flashes and New Diabetes. Hematology Present- Blood Thinners. Not Present- Easy Bruising, Excessive bleeding, Gland problems, HIV and Persistent Infections.  Vitals  07/02/2019 9:41 AM Weight: 193 lb Height: 70in Body Surface Area: 2.06 m Body Mass Index: 27.69 kg/m  Temp.: 98.21F(Oral)  Pulse: 95 (Regular)  BP: 154/88 (Sitting, Left Arm, Standard)        Physical Exam  General Mental  Status-Alert. General Appearance-Consistent with stated age. Hydration-Well hydrated. Voice-Normal.  Head and Neck Head-normocephalic, atraumatic with no lesions or palpable masses. Trachea-midline. Thyroid Gland Characteristics - normal size and consistency.  Chest and Lung Exam Chest and lung exam reveals -quiet, even and easy respiratory effort with no use of accessory  muscles and on auscultation, normal breath sounds, no adventitious sounds and normal vocal resonance. Inspection Chest Wall - Normal. Back - normal.  Cardiovascular Cardiovascular examination reveals -normal heart sounds, regular rate and rhythm with no murmurs and normal pedal pulses bilaterally.  Abdomen Note: Reducible left internal hernia. Scar in right groin from hernia repair as a child.  Neurologic Neurologic evaluation reveals -alert and oriented x 3 with no impairment of recent or remote memory. Mental Status-Normal.  Musculoskeletal Normal Exam - Left-Upper Extremity Strength Normal and Lower Extremity Strength Normal. Normal Exam - Right-Upper Extremity Strength Normal and Lower Extremity Strength Normal.    Assessment & Plan   LEFT INGUINAL HERNIA (K40.90) Impression: Recommend repair with mesh. Discussed laparoscopic as well but he has a previous large midline incision therefore making this less of an option. The risk of hernia repair include bleeding, infection, organ injury, bowel injury, bladder injury, nerve injury recurrent hernia, blood clots, worsening of underlying condition, chronic pain, mesh use, open surgery, death, and the need for other operattions. Pt agrees to proceed  Current Plans You are being scheduled for surgery- Our schedulers will call you.  You should hear from our office's scheduling department within 5 working days about the location, date, and time of surgery. We try to make accommodations for patient's preferences in scheduling  surgery, but sometimes the OR schedule or the surgeon's schedule prevents [place] from making those accommodations.  If you have not heard from our office [phone]) in 5 working days, call the office and ask for your surgeon's nurse.  If you have other questions about your diagnosis, plan, or surgery, call the office and ask for your surgeon's nurse.  Pt Education - Pamphlet Given - Hernia Surgery: discussed with patient and provided information. Pt Education - CCS Mesh education: discussed with patient and provided information. Pt Education - Consent for inguinal hernia : discussed with patient and provided information.

## 2019-08-07 NOTE — Transfer of Care (Signed)
Immediate Anesthesia Transfer of Care Note  Patient: Timothy Mendez  Procedure(s) Performed: LEFT INGUINAL HERNIA REPAIR WITH MESH (Left Abdomen)  Patient Location: PACU  Anesthesia Type:General and Regional  Level of Consciousness: drowsy  Airway & Oxygen Therapy: Patient Spontanous Breathing and Patient connected to face mask oxygen  Post-op Assessment: Report given to RN and Post -op Vital signs reviewed and stable  Post vital signs: Reviewed and stable  Last Vitals:  Vitals Value Taken Time  BP 122/57 08/07/19 1526  Temp    Pulse 51 08/07/19 1529  Resp 13 08/07/19 1529  SpO2 100 % 08/07/19 1529  Vitals shown include unvalidated device data.  Last Pain:  Vitals:   08/07/19 1238  TempSrc: Oral  PainSc: 0-No pain         Complications: No apparent anesthesia complications

## 2019-08-07 NOTE — Progress Notes (Signed)
Assisted Dr. Carignan with left, ultrasound guided, transabdominal plane block. Side rails up, monitors on throughout procedure. See vital signs in flow sheet. Tolerated Procedure well. 

## 2019-08-07 NOTE — Anesthesia Postprocedure Evaluation (Signed)
Anesthesia Post Note  Patient: Timothy Mendez  Procedure(s) Performed: LEFT INGUINAL HERNIA REPAIR WITH MESH (Left Abdomen)     Patient location during evaluation: PACU Anesthesia Type: General Level of consciousness: awake and alert Pain management: pain level controlled Vital Signs Assessment: post-procedure vital signs reviewed and stable Respiratory status: spontaneous breathing, nonlabored ventilation, respiratory function stable and patient connected to nasal cannula oxygen Cardiovascular status: blood pressure returned to baseline and stable Postop Assessment: no apparent nausea or vomiting Anesthetic complications: no    Last Vitals:  Vitals:   08/07/19 1527 08/07/19 1530  BP:  123/63  Pulse: (!) 51 (!) 52  Resp: 14 13  Temp:  36.7 C  SpO2: 100% 100%    Last Pain:  Vitals:   08/07/19 1238  TempSrc: Oral  PainSc: 0-No pain                 Montez Hageman

## 2019-08-08 ENCOUNTER — Encounter (HOSPITAL_BASED_OUTPATIENT_CLINIC_OR_DEPARTMENT_OTHER): Payer: Self-pay | Admitting: Surgery

## 2019-08-14 ENCOUNTER — Telehealth: Payer: Self-pay

## 2019-08-14 DIAGNOSIS — I2510 Atherosclerotic heart disease of native coronary artery without angina pectoris: Secondary | ICD-10-CM

## 2019-08-14 DIAGNOSIS — I10 Essential (primary) hypertension: Secondary | ICD-10-CM

## 2019-08-14 MED ORDER — LOSARTAN POTASSIUM 25 MG PO TABS: 25.0000 mg | ORAL_TABLET | Freq: Every day | ORAL | 6 refills | Status: DC

## 2019-08-14 NOTE — Telephone Encounter (Signed)
Spoke to patient Dr.Jordan reviewed B/P readings.He advised B/P still elevated.He advised start Losartan 25 mg daily.Continue all other medications.Bmet to be done in 2 weeks.Advised continue to monitor B/P.Advised to send readings in 2 weeks for Dr.Jordan to review.

## 2019-08-29 DIAGNOSIS — I2510 Atherosclerotic heart disease of native coronary artery without angina pectoris: Secondary | ICD-10-CM | POA: Diagnosis not present

## 2019-08-29 DIAGNOSIS — I10 Essential (primary) hypertension: Secondary | ICD-10-CM | POA: Diagnosis not present

## 2019-08-29 LAB — BASIC METABOLIC PANEL
BUN/Creatinine Ratio: 18 (ref 10–24)
BUN: 18 mg/dL (ref 8–27)
CO2: 26 mmol/L (ref 20–29)
Calcium: 9.8 mg/dL (ref 8.6–10.2)
Chloride: 103 mmol/L (ref 96–106)
Creatinine, Ser: 1 mg/dL (ref 0.76–1.27)
GFR calc Af Amer: 85 mL/min/{1.73_m2} (ref >59)
GFR calc non Af Amer: 74 mL/min/{1.73_m2} (ref >59)
Glucose: 136 mg/dL — ABNORMAL HIGH (ref 65–99)
Potassium: 4.6 mmol/L (ref 3.5–5.2)
Sodium: 142 mmol/L (ref 134–144)

## 2019-12-03 ENCOUNTER — Other Ambulatory Visit: Payer: Medicare Other

## 2019-12-04 ENCOUNTER — Ambulatory Visit: Payer: Medicare Other | Attending: Internal Medicine

## 2019-12-04 DIAGNOSIS — Z20822 Contact with and (suspected) exposure to covid-19: Secondary | ICD-10-CM

## 2019-12-05 ENCOUNTER — Telehealth: Payer: Self-pay

## 2019-12-05 LAB — NOVEL CORONAVIRUS, NAA: SARS-CoV-2, NAA: DETECTED — AB

## 2019-12-05 NOTE — Telephone Encounter (Signed)
Spoke to patient about email he sent this morning.Advised Dr.Jordan out of office this week.Stated he is feeling better.He has not checked B/P today,but feels like it is okay.Appointment offered.Stated he will wait on Dr.Jordan's advice.Advised to call sooner if B/P still elevated.

## 2019-12-06 ENCOUNTER — Telehealth: Payer: Self-pay

## 2019-12-06 ENCOUNTER — Telehealth: Payer: Self-pay | Admitting: Infectious Diseases

## 2019-12-06 ENCOUNTER — Other Ambulatory Visit: Payer: Self-pay | Admitting: Infectious Diseases

## 2019-12-06 DIAGNOSIS — U071 COVID-19: Secondary | ICD-10-CM

## 2019-12-06 MED ORDER — LOSARTAN POTASSIUM 100 MG PO TABS: 100.0000 mg | ORAL_TABLET | Freq: Every day | ORAL | 3 refills | Status: DC

## 2019-12-06 NOTE — Telephone Encounter (Signed)
Spoke to patient about recent email.Dr.Jordan advised to increase Losartan to 100 mg daily.Advised continue to monitor B/P and call back if elevated.

## 2019-12-06 NOTE — Telephone Encounter (Signed)
Called to discuss with patient about Covid symptoms and the use of bamlanivimab, a monoclonal antibody infusion for those with mild to moderate Covid symptoms and at a high risk of hospitalization.  Pt is qualified for this infusion at the [facility] infusion center due to Age > 66.    Started symptoms on Saturday 12/26. He would like to receive the Regeneron infusion.

## 2019-12-06 NOTE — Telephone Encounter (Signed)
I would have him increase losartan to 100 mg daily. Often when we see this kind of rapid increase in BP during exercise it is a sign of deconditioning so regular aerobic exercise is key.  Sully Dyment Martinique MD, [facility]

## 2019-12-06 NOTE — Progress Notes (Signed)
  I connected by phone with Timothy Mendez on 12/06/2019 at 2:31 PM to discuss the potential use of an new treatment for mild to moderate COVID-19 viral infection in non-hospitalized patients.  This patient is a 73 y.o. male that meets the FDA criteria for Emergency Use Authorization of bamlanivimab or casirivimab\imdevimab.  Has a (+) direct SARS-CoV-2 viral test result  Has mild or moderate COVID-19   Is ? 74 years of age and weighs ? 40 kg  Is NOT hospitalized due to COVID-19  Is NOT requiring oxygen therapy or requiring an increase in baseline oxygen flow rate due to COVID-19  Is within 10 days of symptom onset  Has at least one of the high risk factor(s) for progression to severe COVID-19 and/or hospitalization as defined in EUA.  Specific high risk criteria : >/= 74 yo   I have spoken and communicated the following to the patient or parent/caregiver:  1. FDA has authorized the emergency use of bamlanivimab and casirivimab\imdevimab for the treatment of mild to moderate COVID-19 in adults and pediatric patients with positive results of direct SARS-CoV-2 viral testing who are 25 years of age and older weighing at least 40 kg, and who are at high risk for progressing to severe COVID-19 and/or hospitalization.  2. The significant known and potential risks and benefits of bamlanivimab and casirivimab\imdevimab, and the extent to which such potential risks and benefits are unknown.  3. Information on available alternative treatments and the risks and benefits of those alternatives, including clinical trials.  4. Patients treated with bamlanivimab and casirivimab\imdevimab should continue to self-isolate and use infection control measures (e.g., wear mask, isolate, social distance, avoid sharing personal items, clean and disinfect "high touch" surfaces, and frequent handwashing) according to CDC guidelines.   5. The patient or parent/caregiver has the option to accept or refuse  bamlanivimab or casirivimab\imdevimab .  After reviewing this information with the patient, The patient agreed to proceed with receiving the bamlanimivab infusion and will be provided a copy of the Fact sheet prior to receiving the infusion.  [name] 12/06/2019 2:31 PM

## 2019-12-11 ENCOUNTER — Ambulatory Visit (HOSPITAL_COMMUNITY): Payer: Medicare Other | Attending: Pulmonary Disease

## 2019-12-11 ENCOUNTER — Telehealth: Payer: Self-pay

## 2019-12-11 MED ORDER — LOSARTAN POTASSIUM 50 MG PO TABS: 50.0000 mg | ORAL_TABLET | Freq: Every day | ORAL | 3 refills | Status: DC

## 2019-12-11 NOTE — Telephone Encounter (Signed)
Spoke to patient he stated he had been taking Losartan 50 mg 1/2 tablet daily.Losartan was entered incorrectly in chart.Dr.Jordan advised to take 1 tablet 50 mg daily.Advised to monitor B/P and call back if remains elevated.

## 2019-12-11 NOTE — Telephone Encounter (Signed)
If he was taking 25 mg then double it to 50 mg losartan. Thanks  Zach Tietje Martinique MD, [facility]

## 2020-01-02 DIAGNOSIS — L821 Other seborrheic keratosis: Secondary | ICD-10-CM | POA: Diagnosis not present

## 2020-01-02 DIAGNOSIS — D485 Neoplasm of uncertain behavior of skin: Secondary | ICD-10-CM | POA: Diagnosis not present

## 2020-01-02 DIAGNOSIS — D0439 Carcinoma in situ of skin of other parts of face: Secondary | ICD-10-CM | POA: Diagnosis not present

## 2020-01-02 DIAGNOSIS — D2271 Melanocytic nevi of right lower limb, including hip: Secondary | ICD-10-CM | POA: Diagnosis not present

## 2020-01-02 DIAGNOSIS — B353 Tinea pedis: Secondary | ICD-10-CM | POA: Diagnosis not present

## 2020-01-02 DIAGNOSIS — L570 Actinic keratosis: Secondary | ICD-10-CM | POA: Diagnosis not present

## 2020-01-23 DIAGNOSIS — M1611 Unilateral primary osteoarthritis, right hip: Secondary | ICD-10-CM | POA: Diagnosis not present

## 2020-01-23 DIAGNOSIS — M160 Bilateral primary osteoarthritis of hip: Secondary | ICD-10-CM | POA: Diagnosis not present

## 2020-01-31 DIAGNOSIS — H6123 Impacted cerumen, bilateral: Secondary | ICD-10-CM | POA: Diagnosis not present

## 2020-02-28 DIAGNOSIS — Z1389 Encounter for screening for other disorder: Secondary | ICD-10-CM | POA: Diagnosis not present

## 2020-02-28 DIAGNOSIS — E7800 Pure hypercholesterolemia, unspecified: Secondary | ICD-10-CM | POA: Diagnosis not present

## 2020-02-28 DIAGNOSIS — Z8546 Personal history of malignant neoplasm of prostate: Secondary | ICD-10-CM | POA: Diagnosis not present

## 2020-02-28 DIAGNOSIS — Z0000 Encounter for general adult medical examination without abnormal findings: Secondary | ICD-10-CM | POA: Diagnosis not present

## 2020-02-28 DIAGNOSIS — Z79899 Other long term (current) drug therapy: Secondary | ICD-10-CM | POA: Diagnosis not present

## 2020-02-28 DIAGNOSIS — R072 Precordial pain: Secondary | ICD-10-CM | POA: Diagnosis not present

## 2020-02-28 DIAGNOSIS — I10 Essential (primary) hypertension: Secondary | ICD-10-CM | POA: Diagnosis not present

## 2020-03-05 MED ORDER — ATORVASTATIN CALCIUM 20 MG PO TABS: 20.0000 mg | ORAL_TABLET | Freq: Every day | ORAL | 1 refills | Status: DC

## 2020-06-06 ENCOUNTER — Telehealth: Payer: Self-pay | Admitting: Cardiology

## 2020-06-06 NOTE — Telephone Encounter (Signed)
I attempted to contact patient 06/06/20 to schedule follow up visit from patients recall list. The patient didn't answer, left message for patient to return call to get appt scheduled.

## 2020-06-30 DIAGNOSIS — Z85828 Personal history of other malignant neoplasm of skin: Secondary | ICD-10-CM | POA: Diagnosis not present

## 2020-06-30 DIAGNOSIS — L821 Other seborrheic keratosis: Secondary | ICD-10-CM | POA: Diagnosis not present

## 2020-07-30 DIAGNOSIS — H52223 Regular astigmatism, bilateral: Secondary | ICD-10-CM | POA: Diagnosis not present

## 2020-07-30 DIAGNOSIS — H5203 Hypermetropia, bilateral: Secondary | ICD-10-CM | POA: Diagnosis not present

## 2020-07-30 DIAGNOSIS — H04123 Dry eye syndrome of bilateral lacrimal glands: Secondary | ICD-10-CM | POA: Diagnosis not present

## 2020-07-30 DIAGNOSIS — H25813 Combined forms of age-related cataract, bilateral: Secondary | ICD-10-CM | POA: Diagnosis not present

## 2020-07-30 DIAGNOSIS — H524 Presbyopia: Secondary | ICD-10-CM | POA: Diagnosis not present

## 2020-08-04 NOTE — Progress Notes (Signed)
Cardiology Office Note   Date:  08/07/2020   ID:  Timothy Mendez, Timothy Mendez [birth year], MRN [id number]  PCP:  Lajean Manes, MD  Cardiologist:   Alyssandra Hulsebus Martinique, MD   Chief Complaint  Patient presents with  . Coronary Artery Disease      History of Present Illness: Timothy Mendez is a 74 y.o. male who has a  PMH of CAD, HTN and prostate CA s/p prostatectomy many years ago. Patient was admitted in April 2016 for evaluation of chest discomfort. He underwent coronary CT andultimatelyhadcardiac catheterization by Dr. Liam Graham 03/14/2015 which revealed EF of 60%, severe 80%lesion in the mid RCA which was treated with 3.0 x 38 mm DES postdilated to 3.5 mm, LVEDP 22 mmHg.  He was seen in 2020 by Almyra Deforest PA-C for evaluation of intermittent chest tightness. Myoview obtained on 01/11/2019 showed EF 53%, low risk study, there was ST depression noted during stress in the inferior lead, otherwise perfusion portion showed no evidence of ischemia or infarction. When seen later for follow-up he no longer had significant chest discomfort.  On follow up today he is feeling very well. No chest pain, dyspnea, palpitations, dizziness. Keeps up with his exercise program- primarily riding his bike for 14-16 miles. Able to push it without symptoms and HR generally up to 100-105.    Past Medical History:  Diagnosis Date  . Anginal pain ([place])   . Coronary artery disease   . Hypertension   . Left inguinal hernia   . Osteoarthritis    knees  . Prostate cancer [facility])    prostate cancer    Past Surgical History:  Procedure Laterality Date  . CARDIAC CATHETERIZATION  03/14/2015   Procedure: CORONARY STENT INTERVENTION;  Surgeon: Jettie Booze, MD;  Location: [facility] CATH LAB;  Service: Cardiovascular;;  Prox RCA  . CORONARY ANGIOPLASTY WITH STENT PLACEMENT  03/14/2015   "1"  . INGUINAL HERNIA REPAIR Right    as a child  . INGUINAL HERNIA REPAIR Left 08/07/2019   Procedure: LEFT INGUINAL HERNIA  REPAIR WITH MESH;  Surgeon: Erroll Luna, MD;  Location: [place];  Service: General;  Laterality: Left;  . JOINT REPLACEMENT    . KNEE ARTHROSCOPY Right X 2  . LEFT HEART CATHETERIZATION WITH CORONARY ANGIOGRAM N/A 03/14/2015   Procedure: LEFT HEART CATHETERIZATION WITH CORONARY ANGIOGRAM;  Surgeon: Jettie Booze, MD;  Location: [facility] CATH LAB;  Service: Cardiovascular;  Laterality: N/A;  . PROSTATECTOMY  12/2000   Timothy Mendez 04/21/2011  . SHOULDER ARTHROSCOPY Right 09/2007   Timothy Mendez 04/07/2011  . TONSILLECTOMY    . TOTAL KNEE ARTHROPLASTY Right 07/26/2016   Procedure: RIGHT TOTAL KNEE ARTHROPLASTY;  Surgeon: Vickey Huger, MD;  Location: [place];  Service: Orthopedics;  Laterality: Right;     Current Outpatient Medications  Medication Sig Dispense Refill  . acetaminophen (TYLENOL) 500 MG tablet Take 500 mg by mouth every 6 (six) hours as needed.    Marland Kitchen aspirin EC 81 MG tablet Take 81 mg by mouth daily.    Marland Kitchen atorvastatin (LIPITOR) 20 MG tablet Take 1 tablet (20 mg total) by mouth daily. 90 tablet 1  . celecoxib (CELEBREX) 200 MG capsule TAKE 1 CAPSULE DAILY    . Cholecalciferol (VITAMIN D PO) Take 1 tablet by mouth daily.    Marland Kitchen ibuprofen (ADVIL) 800 MG tablet Take 1 tablet (800 mg total) by mouth every 8 (eight) hours as needed. 30 tablet 0  . Multiple Vitamins-Minerals (MULTIVITAMIN PO) Take 1  tablet by mouth daily.    . nitroGLYCERIN (NITROSTAT) 0.4 MG SL tablet Place 1 tablet (0.4 mg total) under the tongue every 5 (five) minutes as needed for chest pain. 25 tablet 4  . Omega-3 Fatty Acids (FISH OIL PO) Take 1 tablet by mouth daily.    Marland Kitchen oxyCODONE (OXY IR/ROXICODONE) 5 MG immediate release tablet Take 1 tablet (5 mg total) by mouth every 6 (six) hours as needed for severe pain. 15 tablet 0  . sildenafil (REVATIO) 20 MG tablet Take 20 mg by mouth as needed.    Marland Kitchen losartan (COZAAR) 50 MG tablet Take 1 tablet (50 mg total) by mouth daily. 90 tablet 3  . metoprolol tartrate  (LOPRESSOR) 25 MG tablet Take 1 tablet (25 mg total) by mouth 2 (two) times daily. 180 tablet 3   No current facility-administered medications for this visit.    Allergies:   No known allergies    Social History:  The patient  reports that he has quit smoking. His smoking use included cigarettes. He has a 5.00 pack-year smoking history. He has never used smokeless tobacco. He reports current alcohol use. He reports that he does not use drugs.   Family History:  The patient's family history includes Cancer in his father; Diabetes in his brother; Heart attack in his brother; Heart disease in his mother; Lung cancer in his father.    ROS:  Please see the history of present illness.   Otherwise, review of systems are positive for none.   All other systems are reviewed and negative.    PHYSICAL EXAM: VS:  BP 122/66   Pulse (!) 51   Ht 5\' 10"  (1.778 m)   Wt 202 lb (91.6 kg)   SpO2 96%   BMI 28.98 kg/m  , BMI Body mass index is 28.98 kg/m. GEN: Well nourished, well developed, in no acute distress  HEENT: normal  Neck: no JVD, carotid bruits, or masses Cardiac: RRR; no murmurs, rubs, or gallops,no edema  Respiratory:  clear to auscultation bilaterally, normal work of breathing GI: soft, nontender, nondistended, + BS MS: no deformity or atrophy  Skin: warm and dry, no rash Neuro:  Strength and sensation are intact Psych: euthymic mood, full affect   EKG:  EKG is ordered today. The ekg ordered today demonstrates sinus brady rate 51. Normal. I have personally reviewed and interpreted this study.    Recent Labs: 08/29/2019: BUN 18; Creatinine, Ser 1.00; Potassium 4.6; Sodium 142    Lipid Panel    Component Value Date/Time   CHOL 137 03/14/2015 0403   TRIG 99 03/14/2015 0403   HDL 29 (L) 03/14/2015 0403   CHOLHDL 4.7 03/14/2015 0403   VLDL 20 03/14/2015 0403   LDLCALC 88 03/14/2015 0403    dated 02/28/20: cholesterol 100, triglycerides 98, HDL 33, LDL 57., creatinine 1.04.  otherwise CMET and CBC normal.  Wt Readings from Last 3 Encounters:  08/07/20 202 lb (91.6 kg)  08/07/19 189 lb 9.5 oz (86 kg)  06/05/19 187 lb (84.8 kg)      Other studies Reviewed: Additional studies/ records that were reviewed today include:   Myoview 01/11/19: Study Highlights    The left ventricular ejection fraction is mildly decreased (45-54%).  Nuclear stress EF: 53%.  Blood pressure demonstrated a normal response to exercise.  ST segment depression was noted during stress in the II, III, aVF and V6 leads.  The study is normal.  This is a low risk study.  Normal stress nuclear  study with no ischemia or infarction; EF 53 (low normal); normal wall motion.       ASSESSMENT AND PLAN:   1.  CAD: s/p PCI of the mid RCA in 2016 with DES. Myoview in February 2020 showed no ischemia. He is asymptomatic.  We will continue medical therapy. On aspirin, metoprolol, and Lipitor.  2. Hypertension: Blood pressure is well controlled. Continue losartan and metoprolol  3. Hyperlipidemia: Continue Lipitor 20 mg daily. LDL excellent at 57.      Current medicines are reviewed at length with the patient today.  The patient does not have concerns regarding medicines.  The following changes have been made:  no change  Labs/ tests ordered today include:  No orders of the defined types were placed in this encounter.    Disposition:   FU with me in 1 year  Signed, Aayra Hornbaker Martin[place], MD  08/07/2020 9:20 AM    [place] [address], [place], [place], [address] Phone [phone], Fax [phone]

## 2020-08-07 ENCOUNTER — Other Ambulatory Visit: Payer: Self-pay

## 2020-08-07 ENCOUNTER — Encounter: Payer: Self-pay | Admitting: Cardiology

## 2020-08-07 ENCOUNTER — Ambulatory Visit (INDEPENDENT_AMBULATORY_CARE_PROVIDER_SITE_OTHER): Payer: Medicare Other | Admitting: Cardiology

## 2020-08-07 VITALS — BP 122/66 | HR 51 | Ht 70.0 in | Wt 202.0 lb

## 2020-08-07 DIAGNOSIS — I10 Essential (primary) hypertension: Secondary | ICD-10-CM

## 2020-08-07 DIAGNOSIS — I2510 Atherosclerotic heart disease of native coronary artery without angina pectoris: Secondary | ICD-10-CM | POA: Diagnosis not present

## 2020-08-07 DIAGNOSIS — E785 Hyperlipidemia, unspecified: Secondary | ICD-10-CM

## 2020-08-21 DIAGNOSIS — Z1152 Encounter for screening for COVID-19: Secondary | ICD-10-CM | POA: Diagnosis not present

## 2020-08-21 DIAGNOSIS — R05 Cough: Secondary | ICD-10-CM | POA: Diagnosis not present

## 2020-08-21 DIAGNOSIS — B349 Viral infection, unspecified: Secondary | ICD-10-CM | POA: Diagnosis not present

## 2020-08-21 MED ORDER — METOPROLOL TARTRATE 25 MG PO TABS: 25.0000 mg | ORAL_TABLET | Freq: Two times a day (BID) | ORAL | 3 refills | Status: DC

## 2020-08-21 MED ORDER — ATORVASTATIN CALCIUM 20 MG PO TABS: 20.0000 mg | ORAL_TABLET | Freq: Every day | ORAL | 3 refills | Status: DC

## 2020-09-01 DIAGNOSIS — Z79899 Other long term (current) drug therapy: Secondary | ICD-10-CM | POA: Diagnosis not present

## 2020-09-01 DIAGNOSIS — Z23 Encounter for immunization: Secondary | ICD-10-CM | POA: Diagnosis not present

## 2020-09-01 DIAGNOSIS — I10 Essential (primary) hypertension: Secondary | ICD-10-CM | POA: Diagnosis not present

## 2020-10-06 DIAGNOSIS — H6123 Impacted cerumen, bilateral: Secondary | ICD-10-CM | POA: Diagnosis not present

## 2020-10-14 DIAGNOSIS — H903 Sensorineural hearing loss, bilateral: Secondary | ICD-10-CM | POA: Diagnosis not present

## 2020-11-06 DIAGNOSIS — R300 Dysuria: Secondary | ICD-10-CM | POA: Diagnosis not present

## 2020-12-30 DIAGNOSIS — D485 Neoplasm of uncertain behavior of skin: Secondary | ICD-10-CM | POA: Diagnosis not present

## 2020-12-30 DIAGNOSIS — L821 Other seborrheic keratosis: Secondary | ICD-10-CM | POA: Diagnosis not present

## 2020-12-30 DIAGNOSIS — L570 Actinic keratosis: Secondary | ICD-10-CM | POA: Diagnosis not present

## 2020-12-30 DIAGNOSIS — D1801 Hemangioma of skin and subcutaneous tissue: Secondary | ICD-10-CM | POA: Diagnosis not present

## 2020-12-30 DIAGNOSIS — L814 Other melanin hyperpigmentation: Secondary | ICD-10-CM | POA: Diagnosis not present

## 2020-12-30 DIAGNOSIS — D2271 Melanocytic nevi of right lower limb, including hip: Secondary | ICD-10-CM | POA: Diagnosis not present

## 2020-12-30 DIAGNOSIS — L853 Xerosis cutis: Secondary | ICD-10-CM | POA: Diagnosis not present

## 2021-03-02 DIAGNOSIS — Z79899 Other long term (current) drug therapy: Secondary | ICD-10-CM | POA: Diagnosis not present

## 2021-03-02 DIAGNOSIS — I10 Essential (primary) hypertension: Secondary | ICD-10-CM | POA: Diagnosis not present

## 2021-03-02 DIAGNOSIS — Z0000 Encounter for general adult medical examination without abnormal findings: Secondary | ICD-10-CM | POA: Diagnosis not present

## 2021-03-02 DIAGNOSIS — Z8546 Personal history of malignant neoplasm of prostate: Secondary | ICD-10-CM | POA: Diagnosis not present

## 2021-03-02 DIAGNOSIS — Z1159 Encounter for screening for other viral diseases: Secondary | ICD-10-CM | POA: Diagnosis not present

## 2021-03-02 DIAGNOSIS — E7800 Pure hypercholesterolemia, unspecified: Secondary | ICD-10-CM | POA: Diagnosis not present

## 2021-03-02 DIAGNOSIS — Z1389 Encounter for screening for other disorder: Secondary | ICD-10-CM | POA: Diagnosis not present

## 2021-04-22 DIAGNOSIS — L570 Actinic keratosis: Secondary | ICD-10-CM | POA: Diagnosis not present

## 2021-04-27 DIAGNOSIS — H6123 Impacted cerumen, bilateral: Secondary | ICD-10-CM | POA: Diagnosis not present

## 2021-06-02 ENCOUNTER — Other Ambulatory Visit: Payer: Self-pay

## 2021-06-02 MED ORDER — LOSARTAN POTASSIUM 50 MG PO TABS: 50.0000 mg | ORAL_TABLET | Freq: Every day | ORAL | 3 refills | Status: DC

## 2021-07-31 NOTE — Progress Notes (Signed)
Cardiology Office Note   Date:  08/04/2021   ID:  Timothy, Mendez March 19, 1946, MRN [id number]  PCP:  Lajean Manes, MD  Cardiologist:   Dajaun Goldring [place], MD   Chief Complaint  Patient presents with   Coronary Artery Disease       History of Present Illness: Timothy Mendez is a 76 y.o. male who has a  PMH of CAD, HTN and prostate CA s/p prostatectomy many years ago.  Patient was admitted in April 2016 for evaluation of chest discomfort.  He underwent coronary CT and ultimately had cardiac catheterization by Dr. Irish Lack on 03/14/2015 which revealed EF of 60%, severe 80% lesion in the mid RCA which was treated with 3.0 x 38 mm DES postdilated to 3.5 mm, LVEDP 22 mmHg.   He was seen in 2020 by Almyra Deforest PA-C for evaluation of intermittent chest tightness. Myoview obtained on 01/11/2019 showed EF 53%, low risk study, there was ST depression noted during stress in the inferior lead, otherwise perfusion portion showed no evidence of ischemia or infarction.  When seen later for follow-up he no longer had significant chest discomfort.   On follow up today he is feeling very well. No chest pain, dyspnea, palpitations, dizziness. Keeps up with his exercise program- primarily riding his bike for 14-16 miles. Also playing golf. Very rarely notes BP surge going up incline.    Past Medical History:  Diagnosis Date   Anginal pain ([place])    Coronary artery disease    Hypertension    Left inguinal hernia    Osteoarthritis    knees   Prostate cancer [facility])    prostate cancer    Past Surgical History:  Procedure Laterality Date   CARDIAC CATHETERIZATION  03/14/2015   Procedure: CORONARY STENT INTERVENTION;  Surgeon: Jettie Booze, MD;  Location: [facility] CATH LAB;  Service: Cardiovascular;;  Prox RCA   CORONARY ANGIOPLASTY WITH STENT PLACEMENT  03/14/2015   "1"   INGUINAL HERNIA REPAIR Right    as a child   INGUINAL HERNIA REPAIR Left 08/07/2019   Procedure: LEFT INGUINAL HERNIA REPAIR WITH  MESH;  Surgeon: Erroll Luna, MD;  Location: [place];  Service: General;  Laterality: Left;   JOINT REPLACEMENT     KNEE ARTHROSCOPY Right X 2   LEFT HEART CATHETERIZATION WITH CORONARY ANGIOGRAM N/A 03/14/2015   Procedure: LEFT HEART CATHETERIZATION WITH CORONARY ANGIOGRAM;  Surgeon: Jettie Booze, MD;  Location: [facility] CATH LAB;  Service: Cardiovascular;  Laterality: N/A;   PROSTATECTOMY  12/2000   Archie Endo 04/21/2011   SHOULDER ARTHROSCOPY Right 09/2007   Archie Endo 04/07/2011   TONSILLECTOMY     TOTAL KNEE ARTHROPLASTY Right 07/26/2016   Procedure: RIGHT TOTAL KNEE ARTHROPLASTY;  Surgeon: Vickey Huger, MD;  Location: [place];  Service: Orthopedics;  Laterality: Right;     Current Outpatient Medications  Medication Sig Dispense Refill   acetaminophen (TYLENOL) 500 MG tablet Take 500 mg by mouth every 6 (six) hours as needed.     aspirin EC 81 MG tablet Take 81 mg by mouth daily.     atorvastatin (LIPITOR) 20 MG tablet Take 1 tablet (20 mg total) by mouth daily. 90 tablet 3   Cholecalciferol (VITAMIN D PO) Take 1 tablet by mouth daily.     ibuprofen (ADVIL) 800 MG tablet Take 1 tablet (800 mg total) by mouth every 8 (eight) hours as needed. 30 tablet 0   losartan (COZAAR) 50 MG tablet Take 1 tablet (50  mg total) by mouth daily. 90 tablet 3   Multiple Vitamins-Minerals (MULTIVITAMIN PO) Take 1 tablet by mouth daily.     nitroGLYCERIN (NITROSTAT) 0.4 MG SL tablet Place 1 tablet (0.4 mg total) under the tongue every 5 (five) minutes as needed for chest pain. 25 tablet 4   Omega-3 Fatty Acids (FISH OIL PO) Take 1 tablet by mouth daily.     oxyCODONE (OXY IR/ROXICODONE) 5 MG immediate release tablet Take 1 tablet (5 mg total) by mouth every 6 (six) hours as needed for severe pain. 15 tablet 0   sildenafil (REVATIO) 20 MG tablet Take 20 mg by mouth as needed.     metoprolol tartrate (LOPRESSOR) 25 MG tablet Take 1 tablet (25 mg total) by mouth 2 (two) times daily. 180 tablet 3   No  current facility-administered medications for this visit.    Allergies:   No known allergies    Social History:  The patient  reports that he has quit smoking. His smoking use included cigarettes. He has a 5.00 pack-year smoking history. He has never used smokeless tobacco. He reports current alcohol use. He reports that he does not use drugs.   Family History:  The patient's family history includes Cancer in his father; Diabetes in his brother; Heart attack in his brother; Heart disease in his mother; Lung cancer in his father.    ROS:  Please see the history of present illness.   Otherwise, review of systems are positive for none.   All other systems are reviewed and negative.    PHYSICAL EXAM: VS:  BP 130/68   Pulse 60   Resp 20   Ht '5\' 10"'$  (1.778 m)   Wt 202 lb 12.8 oz (92 kg)   SpO2 97%   BMI 29.10 kg/m  , BMI Body mass index is 29.1 kg/m. GEN: Well nourished, well developed, in no acute distress  HEENT: normal  Neck: no JVD, carotid bruits, or masses Cardiac: RRR; no murmurs, rubs, or gallops,no edema  Respiratory:  clear to auscultation bilaterally, normal work of breathing GI: soft, nontender, nondistended, + BS MS: no deformity or atrophy  Skin: warm and dry, no rash Neuro:  Strength and sensation are intact Psych: euthymic mood, full affect   EKG:  EKG is ordered today. The ekg ordered today demonstrates NSR 60. Borderline LVH.  I have personally reviewed and interpreted this study.    Recent Labs: No results found for requested labs within last 8760 hours.    Lipid Panel    Component Value Date/Time   CHOL 137 03/14/2015 0403   TRIG 99 03/14/2015 0403   HDL 29 (L) 03/14/2015 0403   CHOLHDL 4.7 03/14/2015 0403   VLDL 20 03/14/2015 0403   LDLCALC 88 03/14/2015 0403    dated 02/28/20: cholesterol 100, triglycerides 98, HDL 33, LDL 57., creatinine 1.04. otherwise CMET and CBC normal. Dated 03/02/21: cholesterol 97, triglycerides 102, HDL 36, LDL 42. A1c  5%. Creatinine 1.18. otherwise CMET and CBC normal.   Wt Readings from Last 3 Encounters:  08/04/21 202 lb 12.8 oz (92 kg)  08/07/20 202 lb (91.6 kg)  08/07/19 189 lb 9.5 oz (86 kg)      Other studies Reviewed: Additional studies/ records that were reviewed today include:   Myoview 01/11/19: Study Highlights     The left ventricular ejection fraction is mildly decreased (45-54%). Nuclear stress EF: 53%. Blood pressure demonstrated a normal response to exercise. ST segment depression was noted during stress in the  II, III, aVF and V6 leads. The study is normal. This is a low risk study.   Normal stress nuclear study with no ischemia or infarction; EF 53 (low normal); normal wall motion.         ASSESSMENT AND PLAN:   1.  CAD: s/p PCI of the mid RCA in 2016 with DES. Myoview in February 2020 showed no ischemia. He is asymptomatic.   We will continue medical therapy.  On aspirin, metoprolol, and Lipitor.   Hypertension: Blood pressure is well controlled. Continue losartan and metoprolol   Hyperlipidemia: Continue Lipitor 20 mg daily. LDL excellent at 42.        Current medicines are reviewed at length with the patient today.  The patient does not have concerns regarding medicines.  The following changes have been made:  no change  Labs/ tests ordered today include:  No orders of the defined types were placed in this encounter.    Disposition:   FU with me in 1 year  Signed, Amela Handley [place], MD  08/04/2021 9:41 AM    [facility] [address], [place], [place], [address] Phone [phone], Fax [phone]

## 2021-08-04 ENCOUNTER — Ambulatory Visit (INDEPENDENT_AMBULATORY_CARE_PROVIDER_SITE_OTHER): Payer: Medicare Other | Admitting: Cardiology

## 2021-08-04 ENCOUNTER — Encounter: Payer: Self-pay | Admitting: Cardiology

## 2021-08-04 ENCOUNTER — Other Ambulatory Visit: Payer: Self-pay

## 2021-08-04 VITALS — BP 130/68 | HR 60 | Resp 20 | Ht 70.0 in | Wt 202.8 lb

## 2021-08-04 DIAGNOSIS — I10 Essential (primary) hypertension: Secondary | ICD-10-CM

## 2021-08-04 DIAGNOSIS — H524 Presbyopia: Secondary | ICD-10-CM | POA: Diagnosis not present

## 2021-08-04 DIAGNOSIS — H35033 Hypertensive retinopathy, bilateral: Secondary | ICD-10-CM | POA: Diagnosis not present

## 2021-08-04 DIAGNOSIS — H52223 Regular astigmatism, bilateral: Secondary | ICD-10-CM | POA: Diagnosis not present

## 2021-08-04 DIAGNOSIS — I2510 Atherosclerotic heart disease of native coronary artery without angina pectoris: Secondary | ICD-10-CM | POA: Diagnosis not present

## 2021-08-04 DIAGNOSIS — E785 Hyperlipidemia, unspecified: Secondary | ICD-10-CM

## 2021-08-04 DIAGNOSIS — H5203 Hypermetropia, bilateral: Secondary | ICD-10-CM | POA: Diagnosis not present

## 2021-08-04 DIAGNOSIS — H04123 Dry eye syndrome of bilateral lacrimal glands: Secondary | ICD-10-CM | POA: Diagnosis not present

## 2021-08-04 DIAGNOSIS — H25813 Combined forms of age-related cataract, bilateral: Secondary | ICD-10-CM | POA: Diagnosis not present

## 2021-08-04 NOTE — Addendum Note (Signed)
Addended by: Kathyrn Lass on: 08/04/2021 09:49 AM   Modules accepted: Orders

## 2021-09-02 DIAGNOSIS — Z79899 Other long term (current) drug therapy: Secondary | ICD-10-CM | POA: Diagnosis not present

## 2021-09-02 DIAGNOSIS — Z23 Encounter for immunization: Secondary | ICD-10-CM | POA: Diagnosis not present

## 2021-09-02 DIAGNOSIS — I10 Essential (primary) hypertension: Secondary | ICD-10-CM | POA: Diagnosis not present

## 2021-09-15 DIAGNOSIS — H6123 Impacted cerumen, bilateral: Secondary | ICD-10-CM | POA: Diagnosis not present

## 2021-09-16 DIAGNOSIS — H52223 Regular astigmatism, bilateral: Secondary | ICD-10-CM | POA: Diagnosis not present

## 2021-09-16 DIAGNOSIS — D3132 Benign neoplasm of left choroid: Secondary | ICD-10-CM | POA: Diagnosis not present

## 2021-09-16 DIAGNOSIS — H524 Presbyopia: Secondary | ICD-10-CM | POA: Diagnosis not present

## 2021-09-16 DIAGNOSIS — H43391 Other vitreous opacities, right eye: Secondary | ICD-10-CM | POA: Diagnosis not present

## 2021-09-16 DIAGNOSIS — H5203 Hypermetropia, bilateral: Secondary | ICD-10-CM | POA: Diagnosis not present

## 2021-10-12 ENCOUNTER — Other Ambulatory Visit: Payer: Self-pay

## 2021-10-12 MED ORDER — ATORVASTATIN CALCIUM 20 MG PO TABS: 20.0000 mg | ORAL_TABLET | Freq: Every day | ORAL | 3 refills | Status: DC

## 2021-10-12 MED ORDER — METOPROLOL TARTRATE 25 MG PO TABS: 25.0000 mg | ORAL_TABLET | Freq: Two times a day (BID) | ORAL | 3 refills | Status: DC

## 2021-11-18 DIAGNOSIS — M542 Cervicalgia: Secondary | ICD-10-CM | POA: Diagnosis not present

## 2021-12-30 DIAGNOSIS — L812 Freckles: Secondary | ICD-10-CM | POA: Diagnosis not present

## 2021-12-30 DIAGNOSIS — B354 Tinea corporis: Secondary | ICD-10-CM | POA: Diagnosis not present

## 2021-12-30 DIAGNOSIS — D1801 Hemangioma of skin and subcutaneous tissue: Secondary | ICD-10-CM | POA: Diagnosis not present

## 2021-12-30 DIAGNOSIS — L820 Inflamed seborrheic keratosis: Secondary | ICD-10-CM | POA: Diagnosis not present

## 2021-12-30 DIAGNOSIS — L821 Other seborrheic keratosis: Secondary | ICD-10-CM | POA: Diagnosis not present

## 2021-12-30 DIAGNOSIS — L308 Other specified dermatitis: Secondary | ICD-10-CM | POA: Diagnosis not present

## 2021-12-30 DIAGNOSIS — B353 Tinea pedis: Secondary | ICD-10-CM | POA: Diagnosis not present

## 2021-12-30 DIAGNOSIS — L570 Actinic keratosis: Secondary | ICD-10-CM | POA: Diagnosis not present

## 2022-02-07 ENCOUNTER — Telehealth: Payer: Self-pay | Admitting: Student in an Organized Health Care Education/Training Program

## 2022-02-07 NOTE — Telephone Encounter (Signed)
Patient called to report that he gets occasional chest pain when exerting himself. He does not actively have chest pain and is currently asymptomatic. He wants to see Dr. Martinique for his symptoms. I will message him regarding this request. No additional intervention is needed at this time.  ?

## 2022-02-08 ENCOUNTER — Telehealth: Payer: Self-pay | Admitting: Cardiology

## 2022-02-08 NOTE — Telephone Encounter (Signed)
Pt sent this message Via Mychart to the sching pool:   ? ? ?In response to your questions, I am currently not experiencing any of the symptoms you suggested. I have not taken nitroglycerin. The symptom I have experienced is just an occasional tightness in my chest when walking up a hill. ?Timothy Mendez ? ? ?- ? ?Good Morning Bill,  ?Can you tell me a little more about the chest pain or the tightness that you are having?   ? ?1. Are you having CP right now?  ? ?2. Are you experiencing any other symptoms (ex. SOB, nausea, vomiting, sweating)?  ? ?3. How long have you been experiencing CP?  ? ?4. Is your CP continuous or coming and going?  ? ?5. Have you taken Nitroglycerin?  ??  ? ? ? ? ?Appointment Request From: Milderd Meager ? ?With Provider: Peter Martinique, MD [facility] ? ?Preferred Date Range: 02/08/2022 - 03/01/2022 ? ?Preferred Times: Any Time ? ?Reason for visit: Office Visit ? ?Comments: ?Occasional tightness in chest when walking up hill. ?

## 2022-02-08 NOTE — Telephone Encounter (Signed)
Follow Up:     Patient is returning Timothy Mendez's call from today. 

## 2022-02-08 NOTE — Telephone Encounter (Signed)
LMTCB

## 2022-02-08 NOTE — Telephone Encounter (Signed)
Returned call to patient no answer.Left message on voice mail to call back. ?

## 2022-02-11 NOTE — Telephone Encounter (Signed)
Spoke to patient he stated he has been having occasional chest tightness when he walks up a incline.No chest tightness at present.Appointment scheduled with Dr.Jordan 3/28 at 9:40 am.Advised to go to ED if needed. ?

## 2022-02-14 ENCOUNTER — Other Ambulatory Visit: Payer: Self-pay

## 2022-02-14 ENCOUNTER — Ambulatory Visit: Admit: 2022-02-14 | Disposition: A | Discharge status: Home / Self Care | Payer: Medicare Other

## 2022-02-14 ENCOUNTER — Ambulatory Visit
Admission: EM | Admit: 2022-02-14 | Discharge: 2022-02-14 | Disposition: A | Discharge status: Home / Self Care | Payer: Medicare Other | Attending: Internal Medicine | Admitting: Internal Medicine

## 2022-02-14 ENCOUNTER — Encounter: Payer: Self-pay | Admitting: Emergency Medicine

## 2022-02-14 DIAGNOSIS — J069 Acute upper respiratory infection, unspecified: Secondary | ICD-10-CM | POA: Diagnosis not present

## 2022-02-14 MED ORDER — AZELASTINE HCL 0.1 % NA SOLN: 1.0000 | Freq: Two times a day (BID) | NASAL | 0 refills | Status: AC

## 2022-02-14 MED ORDER — BENZONATATE 100 MG PO CAPS: 100.0000 mg | ORAL_CAPSULE | Freq: Three times a day (TID) | ORAL | 0 refills | Status: AC | PRN

## 2022-02-14 NOTE — ED Triage Notes (Signed)
Patient c/o productive cough, nasal drainage, headache, sinus pressure x 1 week.  Patient has taken Tylenol, OTC allergy meds. ?

## 2022-02-14 NOTE — Discharge Instructions (Signed)
It appears that you have a viral upper respiratory infection.  You have been prescribed 2 medications to help alleviate symptoms.  COVID test is pending.  We will call if it is positive. ?

## 2022-02-14 NOTE — ED Provider Notes (Signed)
[facility]    CSN: [id number] Arrival date & time: 02/14/22  1240      History   Chief Complaint Chief Complaint  Patient presents with   Cough    HPI Timothy Mendez is a 76 y.o. male.   Patient presents with cough, nasal drainage, nasal congestion, headache, sinus pressure that has been present for approximately 5 days.  His wife has similar symptoms currently.  Patient denies any known fevers.  Patient has taken Tylenol and over-the-counter allergy medications as well as Flonase with minimal improvement in symptoms.  Denies chest pain, shortness of breath, sore throat, ear pain, nausea, vomiting, diarrhea, abdominal pain.  Denies history of asthma or COPD.   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   Coronary artery disease involving native coronary artery of native heart with angina pectoris ([place])    Chest pain 03/14/2015   Pain in the chest    Hypertension 03/13/2015   CAD (coronary artery disease), native coronary artery 03/13/2015   Unstable angina pectoris ([place])    History of prostate cancer     Past Surgical History:  Procedure Laterality Date   CARDIAC CATHETERIZATION  03/14/2015   Procedure: CORONARY STENT INTERVENTION;  Surgeon: Jettie Booze, MD;  Location: [facility] CATH LAB;  Service: Cardiovascular;;  Prox RCA   CORONARY ANGIOPLASTY WITH STENT PLACEMENT  03/14/2015   "1"   INGUINAL HERNIA REPAIR Right    as a child   INGUINAL HERNIA REPAIR Left 08/07/2019   Procedure: LEFT INGUINAL HERNIA REPAIR WITH MESH;  Surgeon: Erroll Luna, MD;  Location: [place];  Service: General;  Laterality: Left;   JOINT REPLACEMENT     KNEE ARTHROSCOPY Right X 2   LEFT HEART  CATHETERIZATION WITH CORONARY ANGIOGRAM N/A 03/14/2015   Procedure: LEFT HEART CATHETERIZATION WITH CORONARY ANGIOGRAM;  Surgeon: Jettie Booze, MD;  Location: [facility] CATH LAB;  Service: Cardiovascular;  Laterality: N/A;   PROSTATECTOMY  12/2000   Archie Endo 04/21/2011   SHOULDER ARTHROSCOPY Right 09/2007   Archie Endo 04/07/2011   TONSILLECTOMY     TOTAL KNEE ARTHROPLASTY Right 07/26/2016   Procedure: RIGHT TOTAL KNEE ARTHROPLASTY;  Surgeon: Vickey Huger, MD;  Location: [place];  Service: Orthopedics;  Laterality: Right;       Home Medications    Prior to Admission medications   Medication Sig Start Date End Date Taking? Authorizing Provider  acetaminophen (TYLENOL) 500 MG tablet Take 500 mg by mouth every 6 (six) hours as needed.   Yes [provider]  aspirin EC 81 MG tablet Take 81 mg by mouth daily.   Yes [provider]  atorvastatin (LIPITOR) 20 MG tablet Take 1 tablet (20 mg total) by mouth daily. 10/12/21  Yes Martinique, Peter M, MD  azelastine (ASTELIN) 0.1 % nasal spray Place 1 spray into both nostrils 2 (two) times daily. Use in each nostril as directed 02/14/22  Yes Jaylene Schrom, [place] E, FNP  benzonatate (TESSALON) 100 MG capsule Take 1 capsule (100 mg total) by mouth every 8 (eight) hours as needed for cough. 02/14/22  Yes Morris Markham, Michele Rockers, FNP  Cholecalciferol (VITAMIN D PO) Take 1  tablet by mouth daily.   Yes [provider]  ibuprofen (ADVIL) 800 MG tablet Take 1 tablet (800 mg total) by mouth every 8 (eight) hours as needed. 08/07/19  Yes Cornett, Marcello Moores, MD  Multiple Vitamins-Minerals (MULTIVITAMIN PO) Take 1 tablet by mouth daily.   Yes [provider]  nitroGLYCERIN (NITROSTAT) 0.4 MG SL tablet Place 1 tablet (0.4 mg total) under the tongue every 5 (five) minutes as needed for chest pain. 01/05/19  Yes Meng, Isaac Laud, PA  Omega-3 Fatty Acids (FISH OIL PO) Take 1 tablet by mouth daily.   Yes [provider]  oxyCODONE (OXY IR/ROXICODONE) 5 MG immediate release  tablet Take 1 tablet (5 mg total) by mouth every 6 (six) hours as needed for severe pain. 08/07/19  Yes Cornett, Marcello Moores, MD  sildenafil (REVATIO) 20 MG tablet Take 20 mg by mouth as needed. 07/25/20  Yes [provider]  losartan (COZAAR) 50 MG tablet Take 1 tablet (50 mg total) by mouth daily. 06/02/21 08/31/21  Martinique, Peter M, MD  metoprolol tartrate (LOPRESSOR) 25 MG tablet Take 1 tablet (25 mg total) by mouth 2 (two) times daily. 10/12/21 01/10/22  Martinique, Peter M, MD    Family History Family History  Problem Relation Age of Onset   Heart disease Mother    Lung cancer Father    Cancer Father    Diabetes Brother    Heart attack Brother     Social History Social History   Tobacco Use   Smoking status: Former    Packs/day: 1.00    Years: 5.00    Pack years: 5.00    Types: Cigarettes   Smokeless tobacco: Never   Tobacco comments:    "quit smoking in 1976"  Substance Use Topics   Alcohol use: Yes    Alcohol/week: 0.0 standard drinks    Comment: social   Drug use: No     Allergies   Patient has no known allergies.   Review of Systems Review of Systems Per HPI  Physical Exam Triage Vital Signs ED Triage Vitals [02/14/22 1340]  Enc Vitals Group     BP (!) 147/81     Pulse Rate 67     Resp 18     Temp 98.4 F (36.9 C)     Temp Source Oral     SpO2 92 %     Weight 195 lb (88.5 kg)     Height '5\' 10"'$  (1.778 m)     Head Circumference      Peak Flow      Pain Score 1     Pain Loc      Pain Edu?      Excl. in [place]?    No data found.  Updated Vital Signs BP (!) 147/81 (BP Location: Left Arm)    Pulse 67    Temp 98.4 F (36.9 C) (Oral)    Resp 18    Ht '5\' 10"'$  (1.778 m)    Wt 195 lb (88.5 kg)    SpO2 98%    BMI 27.98 kg/m   Visual Acuity Right Eye Distance:   Left Eye Distance:   Bilateral Distance:    Right Eye Near:   Left Eye Near:    Bilateral Near:     Physical Exam Constitutional:      General: He is not in acute distress.    Appearance:  Normal appearance. He is not toxic-appearing or diaphoretic.  HENT:     Head: Normocephalic and atraumatic.  Right Ear: Tympanic membrane and ear canal normal.     Left Ear: Tympanic membrane and ear canal normal.     Nose: Congestion present.     Mouth/Throat:     Mouth: Mucous membranes are moist.     Pharynx: No posterior oropharyngeal erythema.  Eyes:     Extraocular Movements: Extraocular movements intact.     Conjunctiva/sclera: Conjunctivae normal.     Pupils: Pupils are equal, round, and reactive to light.  Cardiovascular:     Rate and Rhythm: Normal rate and regular rhythm.     Pulses: Normal pulses.     Heart sounds: Normal heart sounds.  Pulmonary:     Effort: Pulmonary effort is normal. No respiratory distress.     Breath sounds: Normal breath sounds. No stridor. No wheezing, rhonchi or rales.  Abdominal:     General: Abdomen is flat. Bowel sounds are normal.     Palpations: Abdomen is soft.  Musculoskeletal:        General: Normal range of motion.     Cervical back: Normal range of motion.  Skin:    General: Skin is warm and dry.  Neurological:     General: No focal deficit present.     Mental Status: He is alert and oriented to person, place, and time. Mental status is at baseline.  Psychiatric:        Mood and Affect: Mood normal.        Behavior: Behavior normal.     UC Treatments / Results  Labs (all labs ordered are listed, but only abnormal results are displayed) Labs Reviewed  NOVEL CORONAVIRUS, NAA    EKG   Radiology No results found.  Procedures Procedures (including critical care time)  Medications Ordered in UC Medications - No data to display  Initial Impression / Assessment and Plan / UC Course  I have reviewed the triage vital signs and the nursing notes.  Pertinent labs & imaging results that were available during my care of the patient were reviewed by me and considered in my medical decision making (see chart for  details).     Patient presents with symptoms likely from a viral upper respiratory infection. Differential includes bacterial pneumonia, sinusitis, allergic rhinitis, Covid 19, flu. Do not suspect underlying cardiopulmonary process. Symptoms seem unlikely related to ACS, CHF or COPD exacerbations, pneumonia, pneumothorax. Patient is nontoxic appearing and not in need of emergent medical intervention.  COVID test pending.  Recommended symptom control with over the counter medications.  Patient sent prescriptions.  Return if symptoms fail to improve in 1-2 weeks or you develop shortness of breath, chest pain, severe headache. Patient states understanding and is agreeable.  Discharged with PCP followup.  Final Clinical Impressions(s) / UC Diagnoses   Final diagnoses:  Viral upper respiratory tract infection with cough     Discharge Instructions      It appears that you have a viral upper respiratory infection.  You have been prescribed 2 medications to help alleviate symptoms.  COVID test is pending.  We will call if it is positive.    ED Prescriptions     Medication Sig Dispense Auth. Provider   benzonatate (TESSALON) 100 MG capsule Take 1 capsule (100 mg total) by mouth every 8 (eight) hours as needed for cough. 21 capsule [place], [place] E, [place]   azelastine (ASTELIN) 0.1 % nasal spray Place 1 spray into both nostrils 2 (two) times daily. Use in each nostril as directed 30 mL [place], [place]  E, FNP      PDMP not reviewed this encounter.   Teodora Medici, [place] 02/14/22 1407

## 2022-02-15 LAB — NOVEL CORONAVIRUS, NAA: SARS-CoV-2, NAA: NOT DETECTED

## 2022-02-25 NOTE — Progress Notes (Signed)
?  ?Cardiology Office Note ? ? ?Date:  03/02/2022  ? ?ID:  Timothy Mendez, DOB 31-Dec-1945, MRN [id number] ? ?PCP:  Lajean Manes, MD  ?Cardiologist:   Shawntina Diffee [place]ique, MD  ? ?Chief Complaint  ?Patient presents with  ? Chest Pain  ? ? ? ?  ?History of Present Illness: ?Timothy Mendez is a 76 y.o. male who has a  PMH of CAD, HTN and prostate CA s/p prostatectomy many years ago.  Patient was admitted in April 2016 for evaluation of chest discomfort.  He underwent coronary CT and ultimately had cardiac catheterization by Dr. Irish Lack on 03/14/2015 which revealed EF of 60%, severe 80% lesion in the mid RCA which was treated with 3.0 x 38 mm DES postdilated to 3.5 mm, LVEDP 22 mmHg. ?  ?He was seen in 2020 by Almyra Deforest PA-C for evaluation of intermittent chest tightness. Myoview obtained on 01/11/2019 showed EF 53%, low risk study, there was ST depression noted during stress in the inferior lead, otherwise perfusion portion showed no evidence of ischemia or infarction.  When seen later for follow-up he no longer had significant chest discomfort. ?  ?On follow up today he notes occasional chest tightness only when walking up hills. This does not happen all the time. Able to ride his bike for 14-16 miles once a week. Also playing golf. BP is controlled. He is not really concerned about it.  ? ? ?Past Medical History:  ?Diagnosis Date  ? Anginal pain ([place])   ? Coronary artery disease   ? Hypertension   ? Left inguinal hernia   ? Osteoarthritis   ? knees  ? Prostate cancer ([place])   ? prostate cancer  ? ? ?Past Surgical History:  ?Procedure Laterality Date  ? CARDIAC CATHETERIZATION  03/14/2015  ? Procedure: CORONARY STENT INTERVENTION;  Surgeon: Jettie Booze, MD;  Location[facility] CATH LAB;  Service: Cardiovascular;;  Prox RCA  ? CORONARY ANGIOPLASTY WITH STENT PLACEMENT  03/14/2015  ? "1"  ? INGUINAL HERNIA REPAIR Right   ? as a child  ? INGUINAL HERNIA REPAIR Left 08/07/2019  ? Procedure: LEFT INGUINAL HERNIA REPAIR WITH MESH;   Surgeon: Erroll Luna, MD;  Location: [place];  Service: General;  Laterality: Left;  ? JOINT REPLACEMENT    ? KNEE ARTHROSCOPY Right X 2  ? LEFT HEART CATHETERIZATION WITH CORONARY ANGIOGRAM N/A 03/14/2015  ? Procedure: LEFT HEART CATHETERIZATION WITH CORONARY ANGIOGRAM;  Surgeon: Jettie Booze, MD;  Location: [facility] CATH LAB;  Service: Cardiovascular;  Laterality: N/A;  ? PROSTATECTOMY  12/2000  ? Archie Endo 04/21/2011  ? SHOULDER ARTHROSCOPY Right 09/2007  ? Archie Endo 04/07/2011  ? TONSILLECTOMY    ? TOTAL KNEE ARTHROPLASTY Right 07/26/2016  ? Procedure: RIGHT TOTAL KNEE ARTHROPLASTY;  Surgeon: Vickey Huger, MD;  Location: [place];  Service: Orthopedics;  Laterality: Right;  ? ? ? ?Current Outpatient Medications  ?Medication Sig Dispense Refill  ? acetaminophen (TYLENOL) 500 MG tablet Take 500 mg by mouth every 6 (six) hours as needed.    ? aspirin EC 81 MG tablet Take 81 mg by mouth daily.    ? atorvastatin (LIPITOR) 20 MG tablet Take 1 tablet (20 mg total) by mouth daily. 90 tablet 3  ? azelastine (ASTELIN) 0.1 % nasal spray Place 1 spray into both nostrils 2 (two) times daily. Use in each nostril as directed 30 mL 0  ? benzonatate (TESSALON) 100 MG capsule Take 1 capsule (100 mg total) by mouth every 8 (  eight) hours as needed for cough. 21 capsule 0  ? Cholecalciferol (VITAMIN D PO) Take 1 tablet by mouth daily.    ? ibuprofen (ADVIL) 800 MG tablet Take 1 tablet (800 mg total) by mouth every 8 (eight) hours as needed. 30 tablet 0  ? Multiple Vitamins-Minerals (MULTIVITAMIN PO) Take 1 tablet by mouth daily.    ? nitroGLYCERIN (NITROSTAT) 0.4 MG SL tablet Place 1 tablet (0.4 mg total) under the tongue every 5 (five) minutes as needed for chest pain. 25 tablet 4  ? Omega-3 Fatty Acids (FISH OIL PO) Take 1 tablet by mouth daily.    ? sildenafil (REVATIO) 20 MG tablet Take 20 mg by mouth as needed.    ? losartan (COZAAR) 50 MG tablet Take 1 tablet (50 mg total) by mouth daily. 90 tablet 3  ? metoprolol  tartrate (LOPRESSOR) 25 MG tablet Take 1 tablet (25 mg total) by mouth 2 (two) times daily. 180 tablet 3  ? ?No current facility-administered medications for this visit.  ? ? ?Allergies:   Patient has no known allergies.  ? ? ?Social History:  The patient  reports that he has quit smoking. His smoking use included cigarettes. He has a 5.00 pack-year smoking history. He has never used smokeless tobacco. He reports current alcohol use. He reports that he does not use drugs.  ? ?Family History:  The patient's family history includes Cancer in his father; Diabetes in his brother; Heart attack in his brother; Heart disease in his mother; Lung cancer in his father.  ? ? ?ROS:  Please see the history of present illness.   Otherwise, review of systems are positive for none.   All other systems are reviewed and negative.  ? ? ?PHYSICAL EXAM: ?VS:  BP 140/70 (BP Location: Left Arm)   Pulse (!) 52   Ht '5\' 10"'$  (1.778 m)   Wt 201 lb 9.6 oz (91.4 kg)   SpO2 93%   BMI 28.93 kg/m?  , BMI Body mass index is 28.93 kg/m?. ?GEN: Well nourished, well developed, in no acute distress  ?HEENT: normal  ?Neck: no JVD, carotid bruits, or masses ?Cardiac: RRR; no murmurs, rubs, or gallops,no edema  ?Respiratory:  clear to auscultation bilaterally, normal work of breathing ?GI: soft, nontender, nondistended, + BS ?MS: no deformity or atrophy  ?Skin: warm and dry, no rash ?Neuro:  Strength and sensation are intact ?Psych: euthymic mood, full affect ? ? ?EKG:  EKG is ordered today. ?The ekg ordered today demonstrates NSR 52.  Otherwise normal.   I have personally reviewed and interpreted this study. ? ? ? ?Recent Labs: ?No results found for requested labs within last 8760 hours.  ? ? ?Lipid Panel ?   ?Component Value Date/Time  ? CHOL 137 03/14/2015 0403  ? TRIG 99 03/14/2015 0403  ? HDL 29 (L) 03/14/2015 0403  ? CHOLHDL 4.7 03/14/2015 0403  ? VLDL 20 03/14/2015 0403  ? [place] 88 03/14/2015 0403  ? ? dated 02/28/20: cholesterol 100,  triglycerides 98, HDL 33, LDL 57., creatinine 1.04. otherwise CMET and CBC normal. ?Dated 03/02/21: cholesterol 97, triglycerides 102, HDL 36, LDL 42. A1c 5%. Creatinine 1.18. otherwise CMET and CBC normal.  ?Dated 09/02/21: BMET normal. ? ?Wt Readings from Last 3 Encounters:  ?03/02/22 201 lb 9.6 oz (91.4 kg)  ?02/14/22 195 lb (88.5 kg)  ?08/04/21 202 lb 12.8 oz (92 kg)  ?  ? ? ?Other studies Reviewed: ?Additional studies/ records that were reviewed today include:  ? ?Myoview  01/11/19: Study Highlights ?  ?  ?The left ventricular ejection fraction is mildly decreased (45-54%). ?Nuclear stress EF: 53%. ?Blood pressure demonstrated a normal response to exercise. ?ST segment depression was noted during stress in the II, III, aVF and V6 leads. ?The study is normal. ?This is a low risk study. ?  ?Normal stress nuclear study with no ischemia or infarction; EF 53 (low normal); normal wall motion. ?   ?  ? ? ? ?ASSESSMENT AND PLAN: ? ? ?1.  CAD: s/p PCI of the mid RCA in 2016 with DES. Myoview in February 2020 showed no ischemia. He has stable class 1 angina.   We will continue medical therapy.  On aspirin, metoprolol, and Lipitor. He is to let me know is symptoms increase in frequency, severity, or at lower level of exertion ?  ?Hypertension: Blood pressure is well controlled. Continue losartan and metoprolol ?  ?Hyperlipidemia: Continue Lipitor 20 mg daily. LDL excellent at 42. planning to update labs next week with PCP ?  ?  ? ? ?Current medicines are reviewed at length with the patient today.  The patient does not have concerns regarding medicines. ? ?The following changes have been made:  no change ? ?Labs/ tests ordered today include:  ?No orders of the defined types were placed in this encounter. ? ? ? ? ?Disposition:   FU with me in 1 year ? ?Signed, ?Jasan Doughtie Martinique, MD  ?03/02/2022 9:48 AM    ?[facility] ?[address] [place], [place], [address] ?Phone [phone], Fax [phone] ? ? ?

## 2022-03-02 ENCOUNTER — Ambulatory Visit (INDEPENDENT_AMBULATORY_CARE_PROVIDER_SITE_OTHER): Payer: Medicare Other | Admitting: Cardiology

## 2022-03-02 ENCOUNTER — Other Ambulatory Visit: Payer: Self-pay

## 2022-03-02 ENCOUNTER — Encounter: Payer: Self-pay | Admitting: Cardiology

## 2022-03-02 VITALS — BP 140/70 | HR 52 | Ht 70.0 in | Wt 201.6 lb

## 2022-03-02 DIAGNOSIS — I2510 Atherosclerotic heart disease of native coronary artery without angina pectoris: Secondary | ICD-10-CM

## 2022-03-02 DIAGNOSIS — I10 Essential (primary) hypertension: Secondary | ICD-10-CM | POA: Diagnosis not present

## 2022-03-02 DIAGNOSIS — E785 Hyperlipidemia, unspecified: Secondary | ICD-10-CM | POA: Diagnosis not present

## 2022-03-02 MED ORDER — NITROGLYCERIN 0.4 MG SL SUBL: 0.4000 mg | SUBLINGUAL_TABLET | SUBLINGUAL | 4 refills | Status: AC | PRN

## 2022-03-05 DIAGNOSIS — Z1389 Encounter for screening for other disorder: Secondary | ICD-10-CM | POA: Diagnosis not present

## 2022-03-05 DIAGNOSIS — Z79899 Other long term (current) drug therapy: Secondary | ICD-10-CM | POA: Diagnosis not present

## 2022-03-05 DIAGNOSIS — I2510 Atherosclerotic heart disease of native coronary artery without angina pectoris: Secondary | ICD-10-CM | POA: Diagnosis not present

## 2022-03-05 DIAGNOSIS — Z8546 Personal history of malignant neoplasm of prostate: Secondary | ICD-10-CM | POA: Diagnosis not present

## 2022-03-05 DIAGNOSIS — I10 Essential (primary) hypertension: Secondary | ICD-10-CM | POA: Diagnosis not present

## 2022-03-05 DIAGNOSIS — Z0000 Encounter for general adult medical examination without abnormal findings: Secondary | ICD-10-CM | POA: Diagnosis not present

## 2022-03-05 DIAGNOSIS — Z125 Encounter for screening for malignant neoplasm of prostate: Secondary | ICD-10-CM | POA: Diagnosis not present

## 2022-03-05 DIAGNOSIS — E7800 Pure hypercholesterolemia, unspecified: Secondary | ICD-10-CM | POA: Diagnosis not present

## 2022-03-18 DIAGNOSIS — H6123 Impacted cerumen, bilateral: Secondary | ICD-10-CM | POA: Diagnosis not present

## 2022-03-26 DIAGNOSIS — M25551 Pain in right hip: Secondary | ICD-10-CM | POA: Diagnosis not present

## 2022-03-26 DIAGNOSIS — M545 Low back pain, unspecified: Secondary | ICD-10-CM | POA: Diagnosis not present

## 2022-05-21 DIAGNOSIS — M25551 Pain in right hip: Secondary | ICD-10-CM | POA: Diagnosis not present

## 2022-05-31 ENCOUNTER — Telehealth: Payer: Self-pay | Admitting: Cardiology

## 2022-06-01 MED ORDER — LOSARTAN POTASSIUM 50 MG PO TABS: 50.0000 mg | ORAL_TABLET | Freq: Every day | ORAL | 2 refills | Status: DC

## 2022-06-04 DIAGNOSIS — H903 Sensorineural hearing loss, bilateral: Secondary | ICD-10-CM | POA: Diagnosis not present

## 2022-06-16 DIAGNOSIS — H903 Sensorineural hearing loss, bilateral: Secondary | ICD-10-CM | POA: Diagnosis not present

## 2022-06-16 DIAGNOSIS — H6123 Impacted cerumen, bilateral: Secondary | ICD-10-CM | POA: Diagnosis not present

## 2022-11-19 ENCOUNTER — Other Ambulatory Visit: Payer: Self-pay | Admitting: Cardiology

## 2022-12-17 NOTE — Progress Notes (Signed)
Formatting of this note is different from the original.    Subjective:    Patient ID: Douglas Juarez is a 77 y.o. male.    Chief Complaint   Patient presents with   ? Ear Complaint     HPI   Ear Complaint  Primary Symptom: Ear complaint    Laterality:  Bilateral  Quality: blocked    Duration of current symtoms:  2 months (couple months can tell it's getting blocked when hearing aids squeal)  Onset quality:   slowly over time    Associated symptoms: hearing loss    Associated symptoms: no myalgias, no nasal congestion, no cough, no fatigue, no fever, no headaches, no rash, no rhinorrhea, no swollen glands, no sore throat, no foreign body, no nausea, no diarrhea, no ear redness, no ear edema, no tinnitus and no vomiting    Worsened by:  Hearing aids  Treatments tried: none    Response to Treatment: No change    Special considerations comment:  Hearing aids    Discharge from ear: none        Review of Systems    Social History     Tobacco Use   Smoking Status Former   ? Types: Cigarettes   ? Passive exposure: Past   Smokeless Tobacco Never     Past Medical History:   Diagnosis Date   ? CAD S/P percutaneous coronary angioplasty    ? HL (hearing loss)    ? Hypertension    ? Visual impairment      Past Surgical History:   Procedure Laterality Date   ? CORONARY ANGIOPLASTY WITH STENT PLACEMENT     ? HERNIA REPAIR     ? INGUINAL HERNIA REPAIR     ? MENISCECTOMY Left    ? PROSTATECTOMY     ? TOTAL KNEE ARTHROPLASTY Right      History reviewed. No pertinent family history.  Objective:        Physical Exam  Constitutional:       Appearance: Normal appearance. He is not ill-appearing.   HENT:      Head: Normocephalic and atraumatic.      Right Ear: External ear normal. Decreased hearing noted. Tympanic membrane is injected.      Left Ear: External ear normal. Decreased hearing noted. Tympanic membrane is injected.      Ears:      Comments: Very narrow left canal, states he had always had more wax build up in the left because  of the narrow canal       Nose: Nose normal.      Mouth/Throat:      Lips: Pink.   Cardiovascular:      Rate and Rhythm: Regular rhythm. Bradycardia present.   Pulmonary:      Effort: Pulmonary effort is normal. No accessory muscle usage or respiratory distress.   Skin:     General: Skin is warm.   Neurological:      Mental Status: He is alert.           Assessment/Plan:    HPI provided by Self    Based on today's visit:history and physical exam only, as no relevant testing deemed necessary  patient's visit diagnosis is/includes   1. Impacted cerumen, bilateral    2. Primary hypertension      Patient has a history of chronic conditions and those listed in the visit diagnoses were reviewed today. They are currently unstable on medications.    Treatment plan  includes:   Orders Placed:  No orders of the defined types were placed in this encounter.    Medications ordered this visit      No prescriptions requested or ordered in this encounter     Current medication list and any new medications prescribed or recommended today were reviewed with the patient and specific instructions were provided Yes    Provider Recommendations     Follow up care instructions were provided and reviewed?with the  Patient. All questions were answered. Patient verbalized understanding of plan of care today.            PHQ-2 Total Score: 0  PHQ-9 Total Score: 0                            Procedure Note:     Consent was obtained. Cerumen Removal performed in: Both Ears:  Right Ear Procedure:      Benefits and risks of procedure reviewed with the patient and written consent obtained.  Patient positioned and draped appropriately. Cerumen confirmed by otoscopic visualization in the right ear.  Normal saline instilled in clinic prior to attempted removal.   Cerumen removal attempted using irrigation with tap water at 103.3 degrees F.  Cerumen removal successful.  Complications were not encountered.  Post procedure:  Right canal normal.  Right TM  visualized  and intact.    Left Ear Procedure:      Benefits and risks of procedure reviewed with the patient and written consent is obtained.  Patient positioned and draped appropriately.    Cerumen confirmed by otoscopic visualization in the left ear.  Normal saline instilled in clinic prior to attempted removal.    Cerumen removal attempted using irrigation with tap water at 101.7, 102.1 degrees F.  Cerumen removal successful.  Complications were not encountered.  Post procedure:  Left canal erythematous.  Left TM not visualized.      Electronically signed by Renee Rival, NP at 12/17/2022 12:16 PM EST

## 2023-01-18 ENCOUNTER — Encounter: Payer: Self-pay | Admitting: Cardiology

## 2023-01-18 MED ORDER — LOSARTAN POTASSIUM 50 MG PO TABS: 50.0000 mg | ORAL_TABLET | Freq: Every day | ORAL | 0 refills | Status: DC

## 2023-01-18 MED ORDER — METOPROLOL TARTRATE 25 MG PO TABS: 25.0000 mg | ORAL_TABLET | Freq: Two times a day (BID) | ORAL | 0 refills | Status: AC

## 2023-01-18 MED ORDER — ATORVASTATIN CALCIUM 20 MG PO TABS: 20.0000 mg | ORAL_TABLET | Freq: Every day | ORAL | 0 refills | Status: AC

## 2023-01-21 ENCOUNTER — Ambulatory Visit: Admit: 2023-01-21 | Discharge: 2023-01-21 | Payer: MEDICARE | Attending: Family Medicine | Primary: Family Medicine

## 2023-01-21 DIAGNOSIS — I10 Essential (primary) hypertension: Secondary | ICD-10-CM

## 2023-01-21 LAB — COMPREHENSIVE METABOLIC PANEL
ALT: 24 U/L (ref 12–78)
AST: 14 U/L — ABNORMAL LOW (ref 15–37)
Albumin/Globulin Ratio: 1.8 (ref 1.1–2.2)
Albumin: 4.5 g/dL (ref 3.5–5.0)
Alk Phosphatase: 74 U/L (ref 45–117)
Anion Gap: 2 mmol/L — ABNORMAL LOW (ref 5–15)
BUN: 20 MG/DL (ref 6–20)
Bun/Cre Ratio: 19 (ref 12–20)
CO2: 33 mmol/L — ABNORMAL HIGH (ref 21–32)
Calcium: 9.7 MG/DL (ref 8.5–10.1)
Chloride: 106 mmol/L (ref 97–108)
Creatinine: 1.06 MG/DL (ref 0.70–1.30)
Est, Glom Filt Rate: >60 mL/min/{1.73_m2} (ref >60)
Globulin: 2.5 g/dL (ref 2.0–4.0)
Glucose: 98 mg/dL (ref 65–100)
Potassium: 4.2 mmol/L (ref 3.5–5.1)
Sodium: 141 mmol/L (ref 136–145)
Total Bilirubin: 2.3 MG/DL — ABNORMAL HIGH (ref 0.2–1.0)
Total Protein: 7 g/dL (ref 6.4–8.2)

## 2023-01-21 LAB — CBC
Hematocrit: 44.5 % (ref 36.6–50.3)
Hemoglobin: 15.6 g/dL (ref 12.1–17.0)
MCH: 33.5 PG (ref 26.0–34.0)
MCHC: 35.1 g/dL (ref 30.0–36.5)
MCV: 95.7 FL (ref 80.0–99.0)
MPV: 10.6 FL (ref 8.9–12.9)
Nucleated RBCs: 0 PER 100 WBC
Platelets: 195 10*3/uL (ref 150–400)
RBC: 4.65 M/uL (ref 4.10–5.70)
RDW: 12.8 % (ref 11.5–14.5)
WBC: 6.4 10*3/uL (ref 4.1–11.1)
nRBC: 0 10*3/uL (ref 0.00–0.01)

## 2023-01-21 LAB — HEMOGLOBIN A1C
Estimated Avg Glucose: 100 mg/dL
Hemoglobin A1C: 5.1 % (ref 4.0–5.6)

## 2023-01-21 LAB — LIPID PANEL
Chol/HDL Ratio: 2.7 (ref 0.0–5.0)
Cholesterol, Total: 99 MG/DL (ref <200)
HDL: 37 MG/DL
LDL Calculated: 44 MG/DL (ref 0–100)
Triglycerides: 90 MG/DL (ref <150)
VLDL Cholesterol Calculated: 18 MG/DL

## 2023-01-21 LAB — TSH: TSH, 3RD GENERATION: 0.91 u[IU]/mL (ref 0.36–3.74)

## 2023-01-21 MED ORDER — ZOLPIDEM TARTRATE 5 MG PO TABS
5 MG | ORAL_TABLET | ORAL | 3 refills | Status: AC
Start: 2023-01-21 — End: 2023-05-22

## 2023-01-21 NOTE — Progress Notes (Signed)
Chief Complaint   Patient presents with    Established New Doctor     Just moved here from [facility] 7 weeks ago        "Have you been to the ER, urgent care clinic since your last visit?  Hospitalized since your last visit?"    NO    "Have you seen or consulted any other health care providers outside of [facility] since your last visit?"    NO       Vitals:    01/21/23 0805   BP: (!) 163/83   Site: Left Upper Arm   Position: Sitting   Cuff Size: Large Adult   Pulse: 56   Resp: 18   TempSrc: Temporal   SpO2: 95%   Weight: 93 kg (205 lb)      Health Maintenance Due   Topic Date Due    Lipids  Never done    Annual Wellness Visit (Medicare)  Never done        The patient, Douglas Juarez, identity was verified by name and DOB

## 2023-01-23 NOTE — Progress Notes (Signed)
01/21/2023    Douglas Juarez (DOB:  04/30/1946) is a 77 y.o. male, here as a new patient with history of hypertension hypercholesterolemia primary insomnia currently taking metoprolol 25 mg twice daily atorvastatin 20 mg once nightly and zolpidem 5 mg once nightly present for a preventive medicine evaluation,   HTN   pt also present for Bp check , compliant w/ the bp meds, a low salt diet, an  active life style,  denies Chest Pain, has no legs swelling no lightheadedness,      pt with hx of chronic Insomnia with restlessness, the pat has not been feeling anxious, and  Has not been feeling stressed out,  has no suicidal, no homicidal ideations, patient problem is not falling asleep, the problem is staying asleep,  it gets 1-3 Hours Of sleep, then the pt is up for another 1-2 hours, Then  goes back to sleep, patient has been given sleeping aids in the past currently Zero pill count ,  never abused any meds,  patient has tried some over-the-counter sleeping and No over-the-counter medication helped this patient so for,   pt has  No hx of sleep apnea, no daily fatigue no trouble with TSH, not an anxious person,         There is no problem list on file for this patient.      Review of Systems    Constitutional: no chills and fever, nad     HENT: no ear pain or nosebleeds. No blurred vision  Respiratory: no shortness of breath, wheezing cough   Cardiovascular: Has no chest pain, ,and racing heart .   Gastrointestinal: No constipation, diarrhea, nausea and vomiting.   Genitourinary: No frequency.   Musculoskeletal: Negative for joint pain.   Skin: no itching, no rash.   Neurological: Negative for dizziness, no tremors  Psychiatric/Behavioral: Negative for depression, is not nervous/anxious.      Prior to Visit Medications    Medication Sig Taking? Authorizing Provider   atorvastatin (LIPITOR) 20 MG tablet Take 1 tablet by mouth daily Yes [provider]   metoprolol tartrate (LOPRESSOR) 25 MG tablet Take 1  tablet by mouth 2 times daily Yes [provider]   zolpidem (AMBIEN) 5 MG tablet 1 tablet Orally Once at bedtime, when necessary for 30 days Yes Findley Blankenbaker, MD        No Known Allergies    History reviewed. No pertinent past medical history.    History reviewed. No pertinent surgical history.    Social History     Socioeconomic History    Marital status: Unknown     Spouse name: Not on file    Number of children: Not on file    Years of education: Not on file    Highest education level: Not on file   Occupational History    Not on file   Tobacco Use    Smoking status: Not on file    Smokeless tobacco: Not on file   Substance and Sexual Activity    Alcohol use: Not on file    Drug use: Not on file    Sexual activity: Not on file   Other Topics Concern    Not on file   Social History Narrative    Not on file     Social Determinants of Health     Financial Resource Strain: Not on file   Food Insecurity: Not on file   Transportation Needs: Not on file   Physical Activity: Not on  file   Stress: Not on file   Social Connections: Not on file   Intimate Partner Violence: Not on file   Housing Stability: Not on file        History reviewed. No pertinent family history.    ADVANCE DIRECTIVE: N, <no information>    Vitals:    01/21/23 0805 01/21/23 0854 01/21/23 0856   BP: (!) 163/83 (!) 150/90 (!) 150/80   Site: Left Upper Arm     Position: Sitting     Cuff Size: Large Adult     Pulse: 56     Resp: 18     TempSrc: Temporal     SpO2: 95%     Weight: 93 kg (205 lb)       There is no height or weight on file to calculate BMI.    Physical Exam    General:  alert cooperative appears stated for the age  33; normocephalic and atraumatic PERRLA extraocular motor intact normal tympanic membrane normal external ear bilaterally, mucosal normal no drainage  Neck: supple no adenopathy no JVD no bruit  Lungs: Clear to auscultation bilaterally no rales rhonchi or wheezes  Heart: Normal regular S1-S2 ,  no murmur  Breast  exam deferred  Abdomen: Soft nontender normal bowel sounds   Extremities: Normal range of motion no point tenderness normal pulses no edema  Pelvic/GU: No lesion, no lymphadenopathy  Skin: Normal no lesion no rash          Latest Ref Rng & Units 01/21/2023     9:15 AM   [facility]   A1C 4.0 - 5.6 % 5.1    A1C POC 4.0 - 5.6 % 5.1    GLU random 65 - 100 mg/dL 98    CHOL <200 MG/DL 99    TRIG <150 MG/DL 90    HDL MG/DL 37    LDL CALC 0 - 100 MG/DL 44    NA 136 - 145 mmol/L 141    K 3.5 - 5.1 mmol/L 4.2    BUN 6 - 20 MG/DL 20    CR 0.70 - 1.30 MG/DL 1.06    CA 8.5 - 10.1 MG/DL 9.7    ALT 12 - 78 U/L 24    AST 15 - 37 U/L 14    HGB 12.1 - 17.0 g/dL 15.6        Lab Results   Component Value Date/Time    CHOL 99 01/21/2023 09:15 AM    TRIG 90 01/21/2023 09:15 AM    HDL 37 01/21/2023 09:15 AM    LDLCALC 44 01/21/2023 09:15 AM    GLUCOSE 98 01/21/2023 09:15 AM    LABA1C 5.1 01/21/2023 09:15 AM       The ASCVD Risk score (Arnett DK, et al., 2019) failed to calculate for the following reasons:    The valid total cholesterol range is 130 to 320 mg/dL    The smoking status is invalid      There is no immunization history on file for this patient.    Health Maintenance   Topic Date Due    Annual Wellness Visit (Medicare)  Never done    DTaP/Tdap/Td vaccine (1 - Tdap) 01/22/2024 (Originally [birth year]02/1964)    Flu vaccine (1) 01/22/2024 (Originally 07/06/2022)    Shingles vaccine (1 of 2) 01/22/2024 (Originally 03/03/1995)    Pneumococcal 65+ years Vaccine (1 - PCV) 01/22/2024 (Originally 03/02/2010)    COVID-19 Vaccine (1) 01/22/2024 (Originally 09/02/1946)    Respiratory  Syncytial Virus (RSV) Pregnant or age 38 yrs+ (1 - 1-dose 60+ series) 01/22/2024 (Originally 03/02/2005)    Hepatitis C screen  01/22/2024 (Originally [birth year]03/1963)    Lipids  01/22/2024    Depression Screen  01/22/2024    Hepatitis A vaccine  Aged Out    Hepatitis B vaccine  Aged Out    Hib vaccine  Aged Out    Polio vaccine  Aged Out    Meningococcal (ACWY) vaccine   Aged Out       Assessment & Plan   Primary hypertension  -     Lipid Panel; Future  -     CBC; Future  -     Comprehensive Metabolic Panel; Future  -     Hemoglobin A1C; Future  -     TSH; Future  -     zolpidem (AMBIEN) 5 MG tablet; 1 tablet Orally Once at bedtime, when necessary for 30 days, Disp-30 tablet, R-3Normal  Hypercholesterolemia  -     Lipid Panel; Future  -     CBC; Future  -     Comprehensive Metabolic Panel; Future  -     Hemoglobin A1C; Future  -     TSH; Future  -     zolpidem (AMBIEN) 5 MG tablet; 1 tablet Orally Once at bedtime, when necessary for 30 days, Disp-30 tablet, R-3Normal  Adjustment insomnia  -     Lipid Panel; Future  -     CBC; Future  -     Comprehensive Metabolic Panel; Future  -     Hemoglobin A1C; Future  -     TSH; Future  -     zolpidem (AMBIEN) 5 MG tablet; 1 tablet Orally Once at bedtime, when necessary for 30 days, Disp-30 tablet, R-3Normal  Coronary artery disease involving native heart without angina pectoris, unspecified vessel or lesion type  -     Lipid Panel; Future  -     CBC; Future  -     Comprehensive Metabolic Panel; Future  -     Hemoglobin A1C; Future  -     TSH; Future  -     zolpidem (AMBIEN) 5 MG tablet; 1 tablet Orally Once at bedtime, when necessary for 30 days, Disp-30 tablet, R-3Normal  History of heart artery stent  -     [facility] - Baher, Alex, MD, Cardiac Electrophysiology, [place]  -     zolpidem (AMBIEN) 5 MG tablet; 1 tablet Orally Once at bedtime, when necessary for 30 days, Disp-30 tablet, R-3Normal  Abnormal finding of blood chemistry, unspecified  -     Hemoglobin A1C; Future  aware of its side effect, its addiction and dependency, aware of WD if stopped sudden,  Patient was also told to take the medication only in bed and do not mix with any alcoholic beverages,  \HTN not controlled at this time,   Discussed sodium restriction,  k rich diet,    regardless patient blood pressure need to be checked daily weight records call or return to clinic  in 7 to 14 days for better outcome patient was told to do home monitoring if blood pressure reading greater than 140/90 or less than 90/60 patient was told to call for further advice patient acknowledged understanding and agreed   Lab results reviewed excellent finding.,  Continue with current medication  Return in about 3 months (around 04/21/2023), or if symptoms worsen or fail to improve.         --  Scarlett Presto, MD

## 2023-02-15 ENCOUNTER — Other Ambulatory Visit: Payer: Self-pay | Admitting: Cardiology

## 2023-02-15 MED ORDER — LOSARTAN POTASSIUM 50 MG PO TABS: 50.0000 mg | ORAL_TABLET | Freq: Every day | ORAL | 1 refills | Status: AC

## 2023-03-29 NOTE — Progress Notes (Deleted)
?  ?Cardiology Office Note ? ? ?Date:  03/02/2022  ? ?ID:  Proctor L Bodenheimer, DOB 03/21/1946, MRN [id number] ? ?PCP:  Stoneking, Hal, MD  ?Cardiologist:   Ula Couvillon, MD  ? ?Chief Complaint  ?Patient presents with  ? Chest Pain  ? ? ? ?  ?History of Present Illness: ?Timothy Mendez is a 77 y.o. male who has a  PMH of CAD, HTN and prostate CA s/p prostatectomy many years ago.  Patient was admitted in April 2016 for evaluation of chest discomfort.  He underwent coronary CT and ultimately had cardiac catheterization by Dr. Varanasi on 03/14/2015 which revealed EF of 60%, severe 80% lesion in the mid RCA which was treated with 3.0 x 38 mm DES postdilated to 3.5 mm, LVEDP 22 mmHg. ?  ?He was seen in 2020 by Hao Meng PA-C for evaluation of intermittent chest tightness. Myoview obtained on 01/11/2019 showed EF 53%, low risk study, there was ST depression noted during stress in the inferior lead, otherwise perfusion portion showed no evidence of ischemia or infarction.  When seen later for follow-up he no longer had significant chest discomfort. ?  ?On follow up today he notes occasional chest tightness only when walking up hills. This does not happen all the time. Able to ride his bike for 14-16 miles once a week. Also playing golf. BP is controlled. He is not really concerned about it.  ? ? ?Past Medical History:  ?Diagnosis Date  ? Anginal pain (HCC)   ? Coronary artery disease   ? Hypertension   ? Left inguinal hernia   ? Osteoarthritis   ? knees  ? Prostate cancer (HCC)   ? prostate cancer  ? ? ?Past Surgical History:  ?Procedure Laterality Date  ? CARDIAC CATHETERIZATION  03/14/2015  ? Procedure: CORONARY STENT INTERVENTION;  Surgeon: Jayadeep S Varanasi, MD;  Location: [facility];  Service: Cardiovascular;;  Prox RCA  ? CORONARY ANGIOPLASTY WITH STENT PLACEMENT  03/14/2015  ? "1"  ? INGUINAL HERNIA REPAIR Right   ? as a child  ? INGUINAL HERNIA REPAIR Left 08/07/2019  ? Procedure: LEFT INGUINAL HERNIA REPAIR WITH MESH;   Surgeon: Cornett, Thomas, MD;  Location: [facility];  Service: General;  Laterality: Left;  ? JOINT REPLACEMENT    ? KNEE ARTHROSCOPY Right X 2  ? LEFT HEART CATHETERIZATION WITH CORONARY ANGIOGRAM N/A 03/14/2015  ? Procedure: LEFT HEART CATHETERIZATION WITH CORONARY ANGIOGRAM;  Surgeon: Jayadeep S Varanasi, MD;  Location: [facility];  Service: Cardiovascular;  Laterality: N/A;  ? PROSTATECTOMY  12/2000  ? /notes 04/21/2011  ? SHOULDER ARTHROSCOPY Right 09/2007  ? /notes 04/07/2011  ? TONSILLECTOMY    ? TOTAL KNEE ARTHROPLASTY Right 07/26/2016  ? Procedure: RIGHT TOTAL KNEE ARTHROPLASTY;  Surgeon: Steve Lucey, MD;  Location: [facility] OR;  Service: Orthopedics;  Laterality: Right;  ? ? ? ?Current Outpatient Medications  ?Medication Sig Dispense Refill  ? acetaminophen (TYLENOL) 500 MG tablet Take 500 mg by mouth every 6 (six) hours as needed.    ? aspirin EC 81 MG tablet Take 81 mg by mouth daily.    ? atorvastatin (LIPITOR) 20 MG tablet Take 1 tablet (20 mg total) by mouth daily. 90 tablet 3  ? azelastine (ASTELIN) 0.1 % nasal spray Place 1 spray into both nostrils 2 (two) times daily. Use in each nostril as directed 30 mL 0  ? benzonatate (TESSALON) 100 MG capsule Take 1 capsule (100 mg total) by mouth every 8 (  eight) hours as needed for cough. 21 capsule 0  ? Cholecalciferol (VITAMIN D PO) Take 1 tablet by mouth daily.    ? ibuprofen (ADVIL) 800 MG tablet Take 1 tablet (800 mg total) by mouth every 8 (eight) hours as needed. 30 tablet 0  ? Multiple Vitamins-Minerals (MULTIVITAMIN PO) Take 1 tablet by mouth daily.    ? nitroGLYCERIN (NITROSTAT) 0.4 MG SL tablet Place 1 tablet (0.4 mg total) under the tongue every 5 (five) minutes as needed for chest pain. 25 tablet 4  ? Omega-3 Fatty Acids (FISH OIL PO) Take 1 tablet by mouth daily.    ? sildenafil (REVATIO) 20 MG tablet Take 20 mg by mouth as needed.    ? losartan (COZAAR) 50 MG tablet Take 1 tablet (50 mg total) by mouth daily. 90 tablet 3  ? metoprolol  tartrate (LOPRESSOR) 25 MG tablet Take 1 tablet (25 mg total) by mouth 2 (two) times daily. 180 tablet 3  ? ?No current facility-administered medications for this visit.  ? ? ?Allergies:   Patient has no known allergies.  ? ? ?Social History:  The patient  reports that he has quit smoking. His smoking use included cigarettes. He has a 5.00 pack-year smoking history. He has never used smokeless tobacco. He reports current alcohol use. He reports that he does not use drugs.  ? ?Family History:  The patient's family history includes Cancer in his father; Diabetes in his brother; Heart attack in his brother; Heart disease in his mother; Lung cancer in his father.  ? ? ?ROS:  Please see the history of present illness.   Otherwise, review of systems are positive for none.   All other systems are reviewed and negative.  ? ? ?PHYSICAL EXAM: ?VS:  BP 140/70 (BP Location: Left Arm)   Pulse (!) 52   Ht 5' 10" (1.778 m)   Wt 201 lb 9.6 oz (91.4 kg)   SpO2 93%   BMI 28.93 kg/m?  , BMI Body mass index is 28.93 kg/m?. ?GEN: Well nourished, well developed, in no acute distress  ?HEENT: normal  ?Neck: no JVD, carotid bruits, or masses ?Cardiac: RRR; no murmurs, rubs, or gallops,no edema  ?Respiratory:  clear to auscultation bilaterally, normal work of breathing ?GI: soft, nontender, nondistended, + BS ?MS: no deformity or atrophy  ?Skin: warm and dry, no rash ?Neuro:  Strength and sensation are intact ?Psych: euthymic mood, full affect ? ? ?EKG:  EKG is ordered today. ?The ekg ordered today demonstrates NSR 52.  Otherwise normal.   I have personally reviewed and interpreted this study. ? ? ? ?Recent Labs: ?No results found for requested labs within last 8760 hours.  ? ? ?Lipid Panel ?   ?Component Value Date/Time  ? CHOL 137 03/14/2015 0403  ? TRIG 99 03/14/2015 0403  ? HDL 29 (L) 03/14/2015 0403  ? CHOLHDL 4.7 03/14/2015 0403  ? VLDL 20 03/14/2015 0403  ? LDLCALC 88 03/14/2015 0403  ? ? dated 02/28/20: cholesterol 100,  triglycerides 98, HDL 33, LDL 57., creatinine 1.04. otherwise CMET and CBC normal. ?Dated 03/02/21: cholesterol 97, triglycerides 102, HDL 36, LDL 42. A1c 5%. Creatinine 1.18. otherwise CMET and CBC normal.  ?Dated 09/02/21: BMET normal. ? ?Wt Readings from Last 3 Encounters:  ?03/02/22 201 lb 9.6 oz (91.4 kg)  ?02/14/22 195 lb (88.5 kg)  ?08/04/21 202 lb 12.8 oz (92 kg)  ?  ? ? ?Other studies Reviewed: ?Additional studies/ records that were reviewed today include:  ? ?Myoview   01/11/19: Study Highlights ?  ?  ?The left ventricular ejection fraction is mildly decreased (45-54%). ?Nuclear stress EF: 53%. ?Blood pressure demonstrated a normal response to exercise. ?ST segment depression was noted during stress in the II, III, aVF and V6 leads. ?The study is normal. ?This is a low risk study. ?  ?Normal stress nuclear study with no ischemia or infarction; EF 53 (low normal); normal wall motion. ?   ?  ? ? ? ?ASSESSMENT AND PLAN: ? ? ?1.  CAD: s/p PCI of the mid RCA in 2016 with DES. Myoview in February 2020 showed no ischemia. He has stable class 1 angina.   We will continue medical therapy.  On aspirin, metoprolol, and Lipitor. He is to let me know is symptoms increase in frequency, severity, or at lower level of exertion ?  ?Hypertension: Blood pressure is well controlled. Continue losartan and metoprolol ?  ?Hyperlipidemia: Continue Lipitor 20 mg daily. LDL excellent at 42. planning to update labs next week with PCP ?  ?  ? ? ?Current medicines are reviewed at length with the patient today.  The patient does not have concerns regarding medicines. ? ?The following changes have been made:  no change ? ?Labs/ tests ordered today include:  ?No orders of the defined types were placed in this encounter. ? ? ? ? ?Disposition:   FU with me in 1 year ? ?Signed, ?Stina Gane, MD  ?03/02/2022 9:48 AM    ?[facility] ?[address], [place], [place], [address] ?Phone [phone], Fax [phone] ? ? ?

## 2023-04-04 ENCOUNTER — Ambulatory Visit: Payer: Medicare Other | Admitting: Cardiology

## 2023-04-25 ENCOUNTER — Ambulatory Visit: Payer: MEDICARE | Attending: Family Medicine | Primary: Family Medicine

## 2023-09-14 ENCOUNTER — Ambulatory Visit: Admit: 2023-09-14 | Payer: MEDICARE | Primary: Family Medicine

## 2023-09-14 ENCOUNTER — Encounter: Admit: 2023-09-14 | Discharge: 2023-09-14 | Payer: MEDICARE | Attending: Orthopaedic Surgery | Primary: Family Medicine

## 2023-09-14 DIAGNOSIS — M76891 Other specified enthesopathies of right lower limb, excluding foot: Secondary | ICD-10-CM

## 2023-09-14 DIAGNOSIS — M1611 Unilateral primary osteoarthritis, right hip: Secondary | ICD-10-CM

## 2023-09-14 NOTE — Progress Notes (Signed)
Douglas Juarez (DOB: [birth year]) is a 77 y.o. male, patient, here for evaluation of the following chief complaint(s):  Hip Pain (Right hip pain )       SUBJECTIVE/OBJECTIVE:  Douglas Juarez presents today for evaluation of right hip pain.  Symptoms started a few years ago.  Waxing and waning.  Since making this appointment recent symptoms have improved.  In general when symptoms are at their worst his activities are limited.  At their best he can do everything he wants to do.  Has no pain laying on his right side.  Denies groin pain.  He takes Celebrex as needed which does help.  He has had no other treatment for this other than a corticosteroid injection and trochanteric bursa when symptoms first started.  That injection did help at the time.      PHYSICAL EXAM:  Vitals:   Vitals:    09/14/23 1440   Weight: 88.9 kg (196 lb)   Height: 1.778 m (5\' 10" )      Body mass index is 28.12 kg/m.      77 y.o. year old male, no distress.  Ambulates with normal gait pattern.  Pain-free motion lumbar spine.  No nerve tension signs.  Mild trochanteric tenderness over the right hip.  Pain-free hip range of motion which is well-preserved.  Negative Stinchfield.  Symmetrical palpable distal pulses.  No gross motor or sensory deficits in lower extremities.  No distal edema.      IMAGING:  Radiographs: Xray Result (most recent):  XR HIP 2-3 VW W PELVIS RIGHT 09/14/2023    Narrative  3 x-ray views of right hip including AP pelvis, AP and frog lateral images demonstrate mild to moderate osteoarthritis.  Preserved hip joint space.  Labral calcification and marginal osteophyte formation present, both on the acetabular and femoral side at the head neck junction.  Similar findings in the contralateral left hip.  Severe lumbar and lumbosacral degenerative disc disease present.           ASSESSMENT/PLAN:  1. Hip abductor tendinitis, right  -     Amb External Referral To Physical Therapy  2. Primary osteoarthritis of right hip  -     XR  HIP 2-3 VW W PELVIS RIGHT; Future    Mild radiographic arthritis of the right hip, but presenting symptoms are trochanteric in nature.  Discussed conservative treatment for abductor tendinopathy.  Celebrex does help but he would like to try to avoid taking that on a long-term basis.  Will try a course of physical therapy.  Recommend avoiding trochanteric steroid injections.  He will return as needed.    I am happy to follow his right total knee replacement long-term.  Performed in 2018 before he moved to [place].  Continues to do well.           No follow-ups on file.               Review Of Systems:    Patient denies any recent fever, chills, nausea, vomiting, chest pain, or shortness of breath.    No Known Allergies    Current Outpatient Medications   Medication Sig Dispense Refill    atorvastatin (LIPITOR) 20 MG tablet Take 1 tablet by mouth daily      metoprolol tartrate (LOPRESSOR) 25 MG tablet Take 1 tablet by mouth 2 times daily       No current facility-administered medications for this visit.        Past Medical History:  Diagnosis Date    Bursitis 2022    Heart disease 2018    Hypertension 2018        Past Surgical History:   Procedure Laterality Date    ABDOMEN SURGERY  2020    hernia repair    JOINT REPLACEMENT  2018    right knee    KNEE ARTHROSCOPY  2021    left knee    SHOULDER ARTHROSCOPY  2008       Family History   Problem Relation Age of Onset    Cancer Father     Diabetes Brother         Social History     Socioeconomic History    Marital status: Unknown     Spouse name: Not on file    Number of children: Not on file    Years of education: Not on file    Highest education level: Not on file   Occupational History    Not on file   Tobacco Use    Smoking status: Former     Current packs/day: 0.00     Average packs/day: 0.5 packs/day for 8.7 years (4.3 ttl pk-yrs)     Types: Cigarettes     Start date: 06/06/1967     Quit date: 02/04/1976     Years since quitting: 47.6     Passive exposure: Never     Smokeless tobacco: Never   Substance and Sexual Activity    Alcohol use: Yes     Alcohol/week: 1.0 standard drink of alcohol     Types: 1 Glasses of wine per week    Drug use: Never    Sexual activity: Yes     Partners: Female     Comment: wife   Other Topics Concern    Not on file   Social History Narrative    Not on file     Social Determinants of Health     Financial Resource Strain: Not on file   Food Insecurity: Not on file   Transportation Needs: Not on file   Physical Activity: Not on file   Stress: Not on file   Social Connections: Not on file   Intimate Partner Violence: Not on file   Housing Stability: Not on file       Orders Placed This Encounter   Procedures    XR HIP 2-3 VW W PELVIS RIGHT     3b     Standing Status:   Future     Number of Occurrences:   1     Standing Expiration Date:   09/13/2024    Amb External Referral To Physical Therapy     Referral Priority:   Routine     Referral Type:   Eval and Treat     Referral Reason:   Specialty Services Required     Requested Specialty:   Physical Therapy     Number of Visits Requested:   1        An electronic signature was used to authenticate this note.  -- Sullivan Lone, MD

## 2024-01-18 ENCOUNTER — Encounter: Payer: MEDICARE | Attending: Family Medicine | Primary: Family Medicine

## 2024-01-25 ENCOUNTER — Telehealth: Admit: 2024-01-25 | Discharge: 2024-01-25 | Payer: MEDICARE | Attending: Family Medicine | Primary: Family Medicine

## 2024-01-25 DIAGNOSIS — Z0000 Encounter for general adult medical examination without abnormal findings: Secondary | ICD-10-CM

## 2024-01-25 MED ORDER — DONEPEZIL HCL 5 MG PO TABS
5 | ORAL_TABLET | Freq: Every evening | ORAL | 3 refills | Status: AC
Start: 2024-01-25 — End: ?

## 2024-01-25 MED ORDER — CELECOXIB 200 MG PO CAPS
200 | ORAL_CAPSULE | Freq: Two times a day (BID) | ORAL | 3 refills | Status: AC
Start: 2024-01-25 — End: ?

## 2024-01-25 MED ORDER — ATORVASTATIN CALCIUM 20 MG PO TABS
20 | ORAL_TABLET | Freq: Every day | ORAL | 3 refills | Status: AC
Start: 2024-01-25 — End: ?

## 2024-01-25 MED ORDER — METOPROLOL TARTRATE 25 MG PO TABS
25 | ORAL_TABLET | Freq: Two times a day (BID) | ORAL | 3 refills | Status: AC
Start: 2024-01-25 — End: ?

## 2024-01-25 MED ORDER — LOSARTAN POTASSIUM 50 MG PO TABS
50 | ORAL_TABLET | Freq: Every day | ORAL | 3 refills | Status: AC
Start: 2024-01-25 — End: ?

## 2024-01-25 MED ORDER — SILDENAFIL CITRATE 20 MG PO TABS
20 | ORAL_TABLET | Freq: Every day | ORAL | 3 refills | Status: AC
Start: 2024-01-25 — End: ?

## 2024-01-25 MED ORDER — ZOLPIDEM TARTRATE 5 MG PO TABS
5 | ORAL_TABLET | ORAL | 1 refills | Status: AC
Start: 2024-01-25 — End: 2024-05-25

## 2024-01-25 NOTE — Patient Instructions (Signed)
 Learning About Being Active as an Older Adult  Why is being active important as you get older?     Being active is one of the best things you can do for your health. And it's never too late to start. Being active--or getting active, if you aren't already--has definite benefits. It can:  Give you more energy,  Keep your mind sharp.  Improve balance to reduce your risk of falls.  Help you manage chronic illness with fewer medicines.  No matter how old you are, how fit you are, or what health problems you have, there is a form of activity that will work for you. And the more physical activity you can do, the better your overall health will be.  What kinds of activity can help you stay healthy?  Being more active will make your daily activities easier. Physical activity includes planned exercise and things you do in daily life. There are four types of activity:  Aerobic.  Doing aerobic activity makes your heart and lungs strong.  Includes walking, dancing, and gardening.  Aim for at least 2 hours spread throughout the week.  It improves your energy and can help you sleep better.  Muscle-strengthening.  This type of activity can help maintain muscle and strengthen bones.  Includes climbing stairs, using resistance bands, and lifting or carrying heavy loads.  Aim for at least twice a week.  It can help protect the knees and other joints.  Stretching.  Stretching gives you better range of motion in joints and muscles.  Includes upper arm stretches, calf stretches, and gentle yoga.  Aim for at least twice a week, preferably after your muscles are warmed up from other activities.  It can help you function better in daily life.  Balancing.  This helps you stay coordinated and have good posture.  Includes heel-to-toe walking, tai chi, and certain types of yoga.  Aim for at least 3 days a week.  It can reduce your risk of falling.  Even if you have a hard time meeting the recommendations, it's better to be more active  than less active. All activity done in each category counts toward your weekly total. You'd be surprised how daily things like carrying groceries, keeping up with grandchildren, and taking the stairs can add up.  What keeps you from being active?  If you've had a hard time being more active, you're not alone. Maybe you remember being able to do more. Or maybe you've never thought of yourself as being active. It's frustrating when you can't do the things you want. Being more active can help. What's holding you back?  Getting started.  Have a goal, but break it into easy tasks. Small steps build into big accomplishments.  Staying motivated.  If you feel like skipping your activity, remember your goal. Maybe you want to move better and stay independent. Every activity gets you one step closer.  Not feeling your best.  Start with 5 minutes of an activity you enjoy. Prove to yourself you can do it. As you get comfortable, increase your time.  You may not be where you want to be. But you're in the process of getting there. Everyone starts somewhere.  How can you find safe ways to stay active?  Talk with your doctor about any physical challenges you're facing. Make a plan with your doctor if you have a health problem or aren't sure how to get started with activity.  If you're already active, ask your doctor if  there is anything you should change to stay safe as your body and health change.  If you tend to feel dizzy after you take medicine, avoid activity at that time. Try being active before you take your medicine. This will reduce your risk of falls.  If you plan to be active at home, make sure to clear your space before you get started. Remove things like TV cords, coffee tables, and throw rugs. It's safest to have plenty of space to move freely.  The key to getting more active is to take it slow and steady. Try to improve only a little bit at a time. Pick just one area to improve on at first. And if an activity hurts,  stop and talk to your doctor.  Where can you learn more?  Go to [employer] and enter P600 to learn more about "Learning About Being Active as an Older Adult."  Current as of: July 06, 2023  Content Version: 14.3   [address], [place].   Care instructions adapted under license by [facility]. If you have questions about a medical condition or this instruction, always ask your healthcare professional. Larene Beach, [facility], disclaims any warranty or liability for your use of this information.         Learning About Dental Care for Older Adults  Dental care for older adults: Overview  Dental care for older people is much the same as for younger adults. But older adults do have concerns that younger adults do not. Older adults may have problems with gum disease and decay on the roots of their teeth. They may need missing teeth replaced or broken fillings fixed. Or they may have dentures that need to be cared for. Some older adults may have trouble holding a toothbrush.  You can help remind the person you are caring for to brush and floss their teeth or to clean their dentures. In some cases, you may need to do the brushing and other dental care tasks. People who have trouble using their hands or who have dementia may need this extra help.  How can you help with dental care?  Normal dental care  To keep the teeth and gums healthy:  Brush the teeth with fluoride toothpaste twice a day--in the morning and at night--and floss at least once a day. Plaque can quickly build up on the teeth of older adults.  Watch for the signs of gum disease. These signs include gums that bleed after brushing or after eating hard foods, such as apples.  See a dentist regularly. Many experts recommend checkups every 6 months.  Keep the dentist up to date on any new medications the person is taking.  Encourage a balanced diet that includes whole grains, vegetables, and fruits, and that is low in saturated fat  and sodium.  Encourage the person you're caring for not to use tobacco products. They can affect dental and general health.  Many older adults have a fixed income and feel that they can't afford dental care. But most towns and cities have programs in which dentists help older adults by lowering fees. Contact your area's public health offices or social services for information about dental care in your area.  Using a toothbrush  Older adults with arthritis sometimes have trouble brushing their teeth because they can't easily hold the toothbrush. Their hands and fingers may be stiff, painful, or weak. If this is the case, you can:  Offer an Mining engineer toothbrush.  Enlarge the handle of  a non-electric toothbrush by wrapping a sponge, an elastic bandage, or adhesive tape around it.  Push the toothbrush handle through a ball made of rubber or soft foam.  Make the handle longer and thicker by taping Popsicle sticks or tongue depressors to it.  You may also be able to buy special toothbrushes, toothpaste dispensers, and floss holders.  Your doctor may recommend a soft-bristle toothbrush if the person you care for bleeds easily. Bleeding can happen because of a health problem or from certain medicines.  A toothpaste for sensitive teeth may help if the person you care for has sensitive teeth.  How do you brush and floss someone's teeth?  If the person you are caring for has a hard time cleaning their teeth on their own, you may need to brush and floss their teeth for them. It may be easiest to have the person sit and face away from you, and to sit or stand behind them. That way you can steady their head against your arm as you reach around to floss and brush their teeth. Choose a place that has good lighting and is comfortable for both of you.  Before you begin, gather your supplies. You will need gloves, floss, a toothbrush, and a container to hold water if you are not near a sink. Wash and dry your hands well and put on  gloves. Start by flossing:  Gently work a piece of floss between each of the teeth toward the gums. A plastic flossing tool may make this easier, and they are available at most drugstores.  Curve the floss around each tooth into a U-shape and gently slide it under the gum line.  Move the floss firmly up and down several times to scrape off the plaque.  After you've finished flossing, throw away the used floss and begin brushing:  Wet the brush and apply toothpaste.  Place the brush at a 45-degree angle where the teeth meet the gums. Press firmly, and move the brush in small circles over the surface of the teeth.  Be careful not to brush too hard. Vigorous brushing can make the gums pull away from the teeth and can scratch the tooth enamel.  Brush all surfaces of the teeth, on the tongue side and on the cheek side. Pay special attention to the front teeth and all surfaces of the back teeth.  Brush chewing surfaces with short back-and-forth strokes.  After you've finished, help the person rinse the remaining toothpaste from their mouth.  Where can you learn more?  Go to [employer] and enter F944 to learn more about "Learning About Dental Care for Older Adults."  Current as of: July 06, 2023  Content Version: 14.3   [address] [place].   Care instructions adapted under license by [facility]. If you have questions about a medical condition or this instruction, always ask your healthcare professional. Larene Beach, [facility], disclaims any warranty or liability for your use of this information.         Hearing Loss: Care Instructions  Overview     Hearing loss is a sudden or slow decrease in how well you hear. It can range from slight to profound. Permanent hearing loss can occur with aging. It also can happen when you are exposed long-term to loud noise. Examples include listening to loud music, riding motorcycles, or being around other loud machines.  Hearing loss can affect your  work and home life. It can make you feel lonely or depressed. You  may feel that you have lost your independence. But hearing aids and other devices can help you hear better and feel connected to others.  Follow-up care is a key part of your treatment and safety. Be sure to make and go to all appointments, and call your doctor if you are having problems. It's also a good idea to know your test results and keep a list of the medicines you take.  How can you care for yourself at home?  Avoid loud noises whenever possible. This helps keep your hearing from getting worse.  Always wear hearing protection around loud noises.  Wear a hearing aid as directed.  A professional can help you pick a hearing aid that will work best for you.  You can also get hearing aids over the counter for mild to moderate hearing loss.  Have hearing tests as your doctor suggests. They can show whether your hearing has changed. Your hearing aid may need to be adjusted.  Use other devices as needed. These may include:  Telephone amplifiers and hearing aids that can connect to a television, stereo, radio, or microphone.  Devices that use lights or vibrations. These alert you to the doorbell, a ringing telephone, or a baby monitor.  Television closed-captioning. This shows the words at the bottom of the screen. Most new TVs can do this.  TTY (text telephone). This lets you type messages back and forth on the telephone instead of talking or listening. These devices are also called TDD. When messages are typed on the keyboard, they are sent over the phone line to a receiving TTY. The message is shown on a monitor.  Use text messaging, social media, and email if it is hard for you to communicate by telephone.  Try to learn a listening technique called speechreading. It is not lipreading. You pay attention to people's gestures, expressions, posture, and tone of voice. These clues can help you understand what a person is saying. Face the person you are  talking to, and have them face you. Make sure the lighting is good. You need to see the other person's face clearly.  Think about counseling if you need help to adjust to your hearing loss.  When should you call for help?  Watch closely for changes in your health, and be sure to contact your doctor if:    You think your hearing is getting worse.     You have new symptoms, such as dizziness or nausea.   Where can you learn more?  Go to [employer] and enter R798 to learn more about "Hearing Loss: Care Instructions."  Current as of: September 01, 2022  Content Version: 14.3   [address] [place].   Care instructions adapted under license by [facility]. If you have questions about a medical condition or this instruction, always ask your healthcare professional. Larene Beach, [facility], disclaims any warranty or liability for your use of this information.         Advance Directives: Care Instructions  Overview  An advance directive is a legal way to state your wishes at the end of your life. It tells your family and your doctor what to do if you can't say what you want.  There are two main types of advance directives. You can change them any time your wishes change.  Living will.  This form tells your family and your doctor your wishes about life support and other treatment. The form is also called a declaration.  Medical power  of attorney.  This form lets you name a person to make treatment decisions for you when you can't speak for yourself. This person is called a health care agent (health care proxy, health care surrogate). The form is also called a durable power of attorney for health care.  If you do not have an advance directive, decisions about your medical care may be made by a family member, or by a doctor or a judge who doesn't know you.  It may help to think of an advance directive as a gift to the people who care for you. If you have one, they won't have to make tough  decisions by themselves.  For more information, including forms for your state, see the [employer] website ([employer]cy).  Follow-up care is a key part of your treatment and safety. Be sure to make and go to all appointments, and call your doctor if you are having problems. It's also a good idea to know your test results and keep a list of the medicines you take.  What should you include in an advance directive?  Many states have a unique advance directive form. (It may ask you to address specific issues.) Or you might use a universal form that's approved by many states.  If your form doesn't tell you what to address, it may be hard to know what to include in your advance directive. Use the questions below to help you get started.  Who do you want to make decisions about your medical care if you are not able to?  What life-support measures do you want if you have a serious illness that gets worse over time or can't be cured?  What are you most afraid of that might happen? (Maybe you're afraid of having pain, losing your independence, or being kept alive by machines.)  Where would you prefer to die? (Your home? A hospital? A nursing home?)  Do you want to donate your organs when you die?  Do you want certain religious practices performed before you die?  When should you call for help?  Be sure to contact your doctor if you have any questions.  Where can you learn more?  Go to [employer] and enter R264 to learn more about "Advance Directives: Care Instructions."  Current as of: October 21, 2022  Content Version: 14.3   [address], [place].   Care instructions adapted under license by [facility]. If you have questions about a medical condition or this instruction, always ask your healthcare professional. Larene Beach, [facility], disclaims any warranty or liability for your use of this information.         A Healthy Heart: Care  Instructions  Overview     Coronary artery disease, also called heart disease, occurs when a substance called plaque builds up in the vessels that supply oxygen-rich blood to your heart muscle. This can narrow the blood vessels and reduce blood flow. A heart attack happens when blood flow is completely blocked. A high-fat diet, smoking, and other factors increase the risk of heart disease.  Your doctor has found that you have a chance of having heart disease. A heart-healthy lifestyle can help keep your heart healthy and prevent heart disease. This lifestyle includes eating healthy, being active, staying at a weight that's healthy for you, and not smoking or using tobacco. It also includes taking medicines as directed, managing other health conditions, and trying to get a healthy amount of sleep.  Follow-up care is a key part  of your treatment and safety. Be sure to make and go to all appointments, and call your doctor if you are having problems. It's also a good idea to know your test results and keep a list of the medicines you take.  How can you care for yourself at home?  Diet    Use less salt when you cook and eat. This helps lower your blood pressure. Taste food before salting. Add only a little salt when you think you need it. With time, your taste buds will adjust to less salt.     Eat fewer snack items, fast foods, canned soups, and other high-salt, high-fat, processed foods.     Read food labels and try to avoid saturated and trans fats. They increase your risk of heart disease by raising cholesterol levels.     Limit the amount of solid fat--butter, margarine, and shortening--you eat. Use olive, peanut, or canola oil when you cook. Bake, broil, and steam foods instead of frying them.     Eat a variety of fruit and vegetables every day. Dark green, deep orange, red, or yellow fruits and vegetables are especially good for you. Examples include spinach, carrots, peaches, and berries.     Foods high in fiber  can reduce your cholesterol and provide important vitamins and minerals. High-fiber foods include whole-grain cereals and breads, oatmeal, beans, brown rice, citrus fruits, and apples.     Eat lean proteins. Heart-healthy proteins include seafood, lean meats and poultry, eggs, beans, peas, nuts, seeds, and soy products.     Limit drinks and foods with added sugar. These include candy, desserts, and soda pop.   Heart-healthy lifestyle    If your doctor recommends it, get more exercise. For many people, walking is a good choice. Or you may want to swim, bike, or do other activities. Bit by bit, increase the time you're active every day. Try for at least 30 minutes on most days of the week.     Try to quit or cut back on using tobacco and other nicotine products. This includes smoking and vaping. If you need help quitting, talk to your doctor about stop-smoking programs and medicines. These can increase your chances of quitting for good. Quitting is one of the most important things you can do to protect your heart. It is never too late to quit. Try to avoid secondhand smoke too.     Stay at a weight that's healthy for you. Talk to your doctor if you need help losing weight.     Try to get 7 to 9 hours of sleep each night.     Limit alcohol to 2 drinks a day for men and 1 drink a day for women. Too much alcohol can cause health problems.     Manage other health problems such as diabetes, high blood pressure, and high cholesterol. If you think you may have a problem with alcohol or drug use, talk to your doctor.   Medicines    Take your medicines exactly as prescribed. Call your doctor if you think you are having a problem with your medicine.     If your doctor recommends aspirin, take the amount directed each day. Make sure you take aspirin and not another kind of pain reliever, such as acetaminophen (Tylenol).   When should you call for help?   Call 911 if you have symptoms of a heart attack. These may include:     Chest pain or pressure, or a strange feeling  in the chest.     Sweating.     Shortness of breath.     Pain, pressure, or a strange feeling in the back, neck, jaw, or upper belly or in one or both shoulders or arms.     Lightheadedness or sudden weakness.     A fast or irregular heartbeat.   After you call 911, the operator may tell you to chew 1 adult-strength or 2 to 4 low-dose aspirin. Wait for an ambulance. Do not try to drive yourself.  Watch closely for changes in your health, and be sure to contact your doctor if you have any problems.  Where can you learn more?  Go to [employer].ca and enter F075 to learn more about "A Healthy Heart: Care Instructions."  Current as of: July 06, 2023  Content Version: 14.3   [address], [place].   Care instructions adapted under license by [facility]. If you have questions about a medical condition or this instruction, always ask your healthcare professional. Larene Beach, [facility], disclaims any warranty or liability for your use of this information.    Personalized Preventive Plan for Lochlin Eppinger - 01/25/2024  Medicare offers a range of preventive health benefits. Some of the tests and screenings are paid in full while other may be subject to a deductible, co-insurance, and/or copay.  Some of these benefits include a comprehensive review of your medical history including lifestyle, illnesses that may run in your family, and various assessments and screenings as appropriate.  After reviewing your medical record and screening and assessments performed today your provider may have ordered immunizations, labs, imaging, and/or referrals for you.  A list of these orders (if applicable) as well as your Preventive Care list are included within your After Visit Summary for your review.

## 2024-01-25 NOTE — Progress Notes (Signed)
 Medicare Annual Wellness Visit    Douglas Juarez is here for Medicare AWV    Assessment & Plan   Initial Medicare annual wellness visit  Primary hypertension  -     Lipid Panel; Future  -     CBC with Auto Differential; Future  -     Hemoglobin A1C; Future  -     TSH + Free T4 Panel; Future  -     Comprehensive Metabolic Panel; Future  -     Urinalysis with Reflex to Culture; Future  -     zolpidem (AMBIEN) 5 MG tablet; 1 tablet Orally Once at bedtime, when necessary for 30 days, Disp-90 tablet, R-1Normal  Hypercholesterolemia  -     Lipid Panel; Future  -     CBC with Auto Differential; Future  -     Hemoglobin A1C; Future  -     TSH + Free T4 Panel; Future  -     Comprehensive Metabolic Panel; Future  -     Urinalysis with Reflex to Culture; Future  -     zolpidem (AMBIEN) 5 MG tablet; 1 tablet Orally Once at bedtime, when necessary for 30 days, Disp-90 tablet, R-1Normal  Adjustment insomnia  -     Lipid Panel; Future  -     CBC with Auto Differential; Future  -     Hemoglobin A1C; Future  -     TSH + Free T4 Panel; Future  -     Comprehensive Metabolic Panel; Future  -     Urinalysis with Reflex to Culture; Future  -     zolpidem (AMBIEN) 5 MG tablet; 1 tablet Orally Once at bedtime, when necessary for 30 days, Disp-90 tablet, R-1Normal  Coronary artery disease involving native heart without angina pectoris, unspecified vessel or lesion type  -     Lipid Panel; Future  -     CBC with Auto Differential; Future  -     Hemoglobin A1C; Future  -     TSH + Free T4 Panel; Future  -     Comprehensive Metabolic Panel; Future  -     Urinalysis with Reflex to Culture; Future  -     zolpidem (AMBIEN) 5 MG tablet; 1 tablet Orally Once at bedtime, when necessary for 30 days, Disp-90 tablet, R-1Normal  History of heart artery stent  -     Lipid Panel; Future  -     CBC with Auto Differential; Future  -     Hemoglobin A1C; Future  -     TSH + Free T4 Panel; Future  -     Comprehensive Metabolic Panel; Future  -     Urinalysis  with Reflex to Culture; Future  -     zolpidem (AMBIEN) 5 MG tablet; 1 tablet Orally Once at bedtime, when necessary for 30 days, Disp-90 tablet, R-1Normal  ACP (advance care planning)  -     PR Advanced Care Planning (16-30 minutes) [57846]  -     Full code       Return in 1 year (on 01/24/2025) for Medicare AWV.     Subjective   The following acute and/or chronic problems were also addressed today:       Patient's complete Health Risk Assessment and screening values have been reviewed and are found in Flowsheets. The following problems were reviewed today and where indicated follow up appointments were made and/or referrals ordered.    Positive Risk Factor  Screenings with Interventions:     Cognitive:      Words recalled: 2 Words Recalled     Total Score Interpretation: Abnormal Mini-Cog  Interventions:  Patient advised to follow-up in this office for further evaluation and treatment            Inactivity:  On average, how many days per week do you engage in moderate to strenuous exercise (like a brisk walk)?: 2 days (!) Abnormal  On average, how many minutes do you engage in exercise at this level?: 30 min  Interventions:  Patient advised to follow up in the office for further evaluation and treatment      Dentist Screen:  Have you seen the dentist within the past year?: (!) No    Intervention:  Advised to schedule with their dentist    Hearing Screen:  Do you or your family notice any trouble with your hearing that hasn't been managed with hearing aids?: (!) Yes    Interventions:  Patient declines any further evaluation or treatment         Constitutional: no fever, nl energy levels, nl sleep patterns, nl appetite, and nl weight fluctuations,  - exercise habits,  nad     HENT: no ear pain or nosebleeds. No blurred vision  Respiratory: no shortness of breath, wheezing cough   Cardiovascular: Has no chest pain, ,and racing heart .   Gastrointestinal: No constipation, diarrhea, nausea and vomiting.   Genitourinary:  No frequency.   Musculoskeletal: Negative for joint pain.   Skin: no itching, no rash.   Neurological: Negative for dizziness, no tremors  Psychiatric/Behavioral: no for depression  no nervous/anxious, nl stress levels, and overall emotional well-being .                Objective    Patient-Reported Vitals  No data recorded          General: alert, cooperative, no distress   Mental  status: normal mood, behavior, speech, dress, motor activity, and thought processes, able to follow commands   HENT: NCAT   Neck: no visualized mass   Resp: no respiratory distress   Neuro: no gross deficits   Skin: no discoloration or lesions of concern on visible areas   Psychiatric: normal affect, consistent with stated mood, no evidence of hallucinations          No Known Allergies  Prior to Visit Medications    Medication Sig Taking? Authorizing Provider   donepezil (ARICEPT) 5 MG tablet Take 1 tablet by mouth nightly Yes Kennard Fildes, MD   atorvastatin (LIPITOR) 20 MG tablet Take 1 tablet by mouth daily Yes Sidra Oldfield, MD   metoprolol tartrate (LOPRESSOR) 25 MG tablet Take 1 tablet by mouth 2 times daily Yes Treyten Monestime, MD   celecoxib (CELEBREX) 200 MG capsule Take 1 capsule by mouth 2 times daily Yes Bristol Soy, MD   losartan (COZAAR) 50 MG tablet Take 1 tablet by mouth daily Yes Brihanna Devenport, MD   zolpidem (AMBIEN) 5 MG tablet 1 tablet Orally Once at bedtime, when necessary for 30 days Yes Elizah Mierzwa, MD   sildenafil (REVATIO) 20 MG tablet Take 2 tablets by mouth daily Yes Maureena Dabbs, MD       CareTeam (Including outside providers/suppliers regularly involved in providing care):   Patient Care Team:  Micheline Chapman, MD as PCP - General (Family Medicine)  Micheline Chapman, MD as PCP - Empaneled Provider     Recommendations for Preventive Services Due: see  orders and patient instructions/AVS.  Recommended screening schedule for the next 5-10 years is provided to the patient in written form:  see Patient Instructions/AVS.     Reviewed and updated this visit:  Tobacco  Allergies  Meds  Problems  Med Hx  Surg Hx  Fam Hx                Ron Beske, was evaluated through a synchronous (real-time) audio-video encounter. The patient (or guardian if applicable) is aware that this is a billable service, which includes applicable co-pays. This Virtual Visit was conducted with patient's (and/or legal guardian's) consent. Patient identification was verified, and a caregiver was present when appropriate.   The patient was located at Home: [address] [place] [address]  Provider was located at [employer] (Appt Dept): [address] [place],  [place] [address]  Confirm you are appropriately licensed, registered, or certified to deliver care in the state where the patient is located as indicated above. If you are not or unsure, please re-schedule the visit: Yes, I confirm.

## 2024-01-25 NOTE — Progress Notes (Signed)
 Chief Complaint   Patient presents with    Medicare AWV       "Have you been to the ER, urgent care clinic since your last visit?  Hospitalized since your last visit?"    NO    "Have you seen or consulted any other health care providers outside of [facility] since your last visit?"    NO        Click Here for Release of Records Request       The patient, Douglas Juarez, identity was verified by name and DOB.

## 2024-01-27 ENCOUNTER — Encounter

## 2024-01-27 ENCOUNTER — Ambulatory Visit
Admit: 2024-01-27 | Discharge: 2024-01-27 | Payer: MEDICARE | Attending: Student in an Organized Health Care Education/Training Program | Primary: Family Medicine

## 2024-01-27 DIAGNOSIS — Z0189 Encounter for other specified special examinations: Secondary | ICD-10-CM

## 2024-01-27 LAB — COMPREHENSIVE METABOLIC PANEL
ALT: 28 U/L (ref 12–78)
AST: 16 U/L (ref 15–37)
Albumin/Globulin Ratio: 1.6 (ref 1.1–2.2)
Albumin: 4.2 g/dL (ref 3.5–5.0)
Alk Phosphatase: 78 U/L (ref 45–117)
Anion Gap: 5 mmol/L (ref 2–12)
BUN/Creatinine Ratio: 19 (ref 12–20)
BUN: 20 mg/dL (ref 6–20)
CO2: 30 mmol/L (ref 21–32)
Calcium: 9.5 mg/dL (ref 8.5–10.1)
Chloride: 105 mmol/L (ref 97–108)
Creatinine: 1.07 mg/dL (ref 0.70–1.30)
Est, Glom Filt Rate: 71 mL/min/{1.73_m2} (ref >60)
Globulin: 2.7 g/dL (ref 2.0–4.0)
Glucose: 139 mg/dL — ABNORMAL HIGH (ref 65–100)
Potassium: 4.4 mmol/L (ref 3.5–5.1)
Sodium: 140 mmol/L (ref 136–145)
Total Bilirubin: 2 mg/dL — ABNORMAL HIGH (ref 0.2–1.0)
Total Protein: 6.9 g/dL (ref 6.4–8.2)

## 2024-01-27 LAB — URINALYSIS WITH REFLEX TO CULTURE
BACTERIA, URINE: NEGATIVE /HPF
Bilirubin, Urine: NEGATIVE
Blood, Urine: NEGATIVE
Glucose, Ur: NEGATIVE mg/dL
Ketones, Urine: NEGATIVE mg/dL
Leukocyte Esterase, Urine: NEGATIVE
Nitrite, Urine: NEGATIVE
Protein, UA: NEGATIVE mg/dL
Specific Gravity, UA: 1.007 (ref 1.003–1.030)
Urobilinogen, Urine: 0.2 EU/dL (ref 0.2–1.0)
pH, Urine: 6 (ref 5.0–8.0)

## 2024-01-27 LAB — TSH + FREE T4 PANEL
T4 Free: 1.2 ng/dL (ref 0.8–1.5)
TSH, 3rd Generation: 0.85 u[IU]/mL (ref 0.36–3.74)

## 2024-01-27 LAB — CBC WITH AUTO DIFFERENTIAL
Basophils %: 0.7 % (ref 0.0–1.0)
Basophils Absolute: 0.04 10*3/uL (ref 0.00–0.10)
Eosinophils %: 1.8 % (ref 0.0–7.0)
Eosinophils Absolute: 0.1 10*3/uL (ref 0.00–0.40)
Hematocrit: 41.8 % (ref 36.6–50.3)
Hemoglobin: 14.3 g/dL (ref 12.1–17.0)
Immature Granulocytes %: 0.2 % (ref 0.0–0.5)
Immature Granulocytes Absolute: 0.01 10*3/uL (ref 0.00–0.04)
Lymphocytes %: 24 % (ref 12.0–49.0)
Lymphocytes Absolute: 1.36 10*3/uL (ref 0.80–3.50)
MCH: 32.6 pg (ref 26.0–34.0)
MCHC: 34.2 g/dL (ref 30.0–36.5)
MCV: 95.4 FL (ref 80.0–99.0)
MPV: 10.4 FL (ref 8.9–12.9)
Monocytes %: 6.9 % (ref 5.0–13.0)
Monocytes Absolute: 0.39 10*3/uL (ref 0.00–1.00)
Neutrophils %: 66.4 % (ref 32.0–75.0)
Neutrophils Absolute: 3.77 10*3/uL (ref 1.80–8.00)
Nucleated RBCs: 0 /100{WBCs}
Platelets: 207 10*3/uL (ref 150–400)
RBC: 4.38 M/uL (ref 4.10–5.70)
RDW: 12.2 % (ref 11.5–14.5)
WBC: 5.7 10*3/uL (ref 4.1–11.1)
nRBC: 0 10*3/uL (ref 0.00–0.01)

## 2024-01-27 LAB — LIPID PANEL
Chol/HDL Ratio: 3 (ref 0.0–5.0)
Cholesterol, Total: 108 mg/dL (ref <200)
HDL: 36 mg/dL
LDL Cholesterol: 51.2 mg/dL (ref 0–100)
Triglycerides: 104 mg/dL (ref <150)
VLDL Cholesterol Calculated: 20.8 mg/dL

## 2024-01-27 LAB — HEMOGLOBIN A1C
Estimated Avg Glucose: 108 mg/dL
Hemoglobin A1C: 5.4 % (ref 4.0–5.6)

## 2024-01-27 NOTE — Progress Notes (Signed)
 Lab only

## 2024-02-09 ENCOUNTER — Ambulatory Visit: Admit: 2024-02-09 | Discharge: 2024-02-09 | Payer: MEDICARE | Attending: Family Medicine | Primary: Family Medicine

## 2024-02-09 VITALS — BP 139/84 | HR 57 | Temp 97.60000°F | Resp 16 | Ht 70.0 in | Wt 205.0 lb

## 2024-02-09 DIAGNOSIS — F5102 Adjustment insomnia: Secondary | ICD-10-CM

## 2024-02-09 NOTE — Progress Notes (Signed)
 Douglas Juarez (DOB:  23-Nov-1946) is a 78 y.o. male,Established patient, here for evaluation of the following chief complaint(s):  No chief complaint on file.      pt with hx of chronic Insomnia with restlessness, patient problem is not falling asleep, the problem is staying asleep,  it gets 1-3 Hours Of sleep, then the pt is up for another 1-2 hours, Then  goes back to sleep, patient has been given sleeping aids in the past, I am not nave to sleeping aids,  currently has no suicidal homicidal ideation has no illusion hallucination, not an anxious person, pt has No hx of sleep apnea, ++with daily fatigue nl thyroid panel in the past, ,      Constitutional: no fever, nl energy levels, nl sleep patterns, nl appetite, and nl weight fluctuations,  - exercise habits,  nad     HENT: no ear pain or nosebleeds. No blurred vision  Respiratory: no shortness of breath, wheezing cough   Cardiovascular: Has no chest pain, ,and racing heart .   Gastrointestinal: No constipation, diarrhea, nausea and vomiting.   Genitourinary: No frequency.   Musculoskeletal: Negative for joint pain.   Skin: no itching, no rash.   Neurological: Negative for dizziness, no tremors  Psychiatric/Behavioral: no for depression  no nervous/anxious, nl stress levels, and overall emotional well-being .        Constitutional: Well developed, well nourished.  non-toxic in appearance, not diaphoretic.   HEENT: PERRL. EOMI.  No nasal  erythema noted.  THROAT: Posterior pharynx has no erythema, no exudates.    Neck:  no cervical lymphadenopathy. Neck is supple   Cardiovascular: Regular rate and rhythm, no murmurs, rubs, or gallops.   Pulmonary: Clear to auscultation bilaterally. Has no wheezing, rales or rhonchi.,  speaking in full sentences, has no accessory muscle used.  Abdomen: Bowel sounds are normal. Having no distension, no palpable mass. Soft,  No tenderness, rebound or guarding.   Musculoskeletal: No peripheral edema, having normal ROM  Skin:    warm and dry. No diaphoresis, rashes, or lesions.   Neurological: Alert, awake,  oriented x3 ,  normal speech.        Assessment & Plan  Adjustment insomnia   Chronic, at goal (stable), continue current treatment plan and medication adherence emphasized         Hypercholesterolemia   Chronic, not at goal (unstable), continue current treatment plan and medication adherence emphasized            no caffienated drink 7 hrs before bed time,   no exercise for to 4 hours before the bedtime, pt was told that this med has addictive problem and cannot drink any Etoh with it, and has to take it only in the bed, and is a dangerous med to be taken outside of the bed, pt was told that on this med the pt can not operate any machinery nor driving, pt's advised was witnessed with nursing staff today     Return in about 1 year (around 02/08/2025), or if symptoms worsen or fail to improve.               An electronic signature was used to authenticate this note.    --Micheline Chapman, MD

## 2024-02-09 NOTE — Progress Notes (Signed)
 Chief Complaint   Patient presents with    Medicare AWV       "Have you been to the ER, urgent care clinic since your last visit?  Hospitalized since your last visit?"    NO    "Have you seen or consulted any other health care providers outside of [facility] since your last visit?"    NO            Click Here for Release of Records Request     No results found for this visit on 02/09/24.   Vitals:    02/09/24 1057 02/09/24 1059   BP: (!) 159/83 139/84   Site: Left Upper Arm Right Upper Arm   Position: Sitting Sitting   Cuff Size: Large Adult Large Adult   Pulse: 57    Resp: 16    Temp: 97.6 F (36.4 C)    TempSrc: Infrared    SpO2: 94%    Weight: 93 kg (205 lb)    Height: 1.778 m (5\' 10" )       There are no preventive care reminders to display for this patient.     The patient, Douglas Juarez, identity was verified by name and DOB.
